# Patient Record
Sex: Female | Born: 1965 | Race: White | Hispanic: No | Smoking: Never smoker
Health system: Southern US, Community
[De-identification: ages and names within clinical notes are randomized; demographics above are authoritative.]

## PROBLEM LIST (undated history)

## (undated) ENCOUNTER — Emergency Department (HOSPITAL_BASED_OUTPATIENT_CLINIC_OR_DEPARTMENT_OTHER): Admission: EM | Source: Home / Self Care

## (undated) DIAGNOSIS — F419 Anxiety disorder, unspecified: Secondary | ICD-10-CM

## (undated) DIAGNOSIS — N200 Calculus of kidney: Secondary | ICD-10-CM

## (undated) DIAGNOSIS — F32A Depression, unspecified: Secondary | ICD-10-CM

## (undated) DIAGNOSIS — F329 Major depressive disorder, single episode, unspecified: Secondary | ICD-10-CM

## (undated) DIAGNOSIS — T7840XA Allergy, unspecified, initial encounter: Secondary | ICD-10-CM

## (undated) DIAGNOSIS — I059 Rheumatic mitral valve disease, unspecified: Secondary | ICD-10-CM

## (undated) HISTORY — DX: Rheumatic mitral valve disease, unspecified: I05.9

## (undated) HISTORY — DX: Depression, unspecified: F32.A

## (undated) HISTORY — DX: Anxiety disorder, unspecified: F41.9

## (undated) HISTORY — DX: Allergy, unspecified, initial encounter: T78.40XA

## (undated) HISTORY — PX: OTHER SURGICAL HISTORY: SHX169

## (undated) HISTORY — DX: Major depressive disorder, single episode, unspecified: F32.9

---

## 1988-08-27 HISTORY — PX: TONSILLECTOMY: SUR1361

## 2000-09-10 ENCOUNTER — Emergency Department (HOSPITAL_COMMUNITY): Admission: EM | Admit: 2000-09-10 | Discharge: 2000-09-11 | Payer: Self-pay | Admitting: Emergency Medicine

## 2000-09-11 ENCOUNTER — Encounter: Payer: Self-pay | Admitting: Emergency Medicine

## 2000-10-08 ENCOUNTER — Emergency Department (HOSPITAL_COMMUNITY): Admission: EM | Admit: 2000-10-08 | Discharge: 2000-10-08 | Payer: Self-pay | Admitting: Emergency Medicine

## 2002-08-27 HISTORY — PX: KIDNEY STONE SURGERY: SHX686

## 2003-12-18 ENCOUNTER — Emergency Department (HOSPITAL_COMMUNITY): Admission: EM | Admit: 2003-12-18 | Discharge: 2003-12-18 | Payer: Self-pay | Admitting: Emergency Medicine

## 2003-12-22 ENCOUNTER — Emergency Department (HOSPITAL_COMMUNITY): Admission: EM | Admit: 2003-12-22 | Discharge: 2003-12-22 | Payer: Self-pay | Admitting: Emergency Medicine

## 2004-03-08 ENCOUNTER — Emergency Department (HOSPITAL_COMMUNITY): Admission: EM | Admit: 2004-03-08 | Discharge: 2004-03-08 | Payer: Self-pay | Admitting: Emergency Medicine

## 2004-03-10 ENCOUNTER — Ambulatory Visit (HOSPITAL_BASED_OUTPATIENT_CLINIC_OR_DEPARTMENT_OTHER): Admission: RE | Admit: 2004-03-10 | Discharge: 2004-03-10 | Payer: Self-pay | Admitting: Urology

## 2004-03-10 ENCOUNTER — Ambulatory Visit (HOSPITAL_COMMUNITY): Admission: RE | Admit: 2004-03-10 | Discharge: 2004-03-10 | Payer: Self-pay | Admitting: Urology

## 2005-04-26 ENCOUNTER — Emergency Department (HOSPITAL_COMMUNITY): Admission: EM | Admit: 2005-04-26 | Discharge: 2005-04-26 | Payer: Self-pay | Admitting: Emergency Medicine

## 2006-04-07 ENCOUNTER — Emergency Department (HOSPITAL_COMMUNITY): Admission: EM | Admit: 2006-04-07 | Discharge: 2006-04-08 | Payer: Self-pay | Admitting: Emergency Medicine

## 2006-04-19 ENCOUNTER — Emergency Department (HOSPITAL_COMMUNITY): Admission: EM | Admit: 2006-04-19 | Discharge: 2006-04-19 | Payer: Self-pay | Admitting: Emergency Medicine

## 2006-05-15 ENCOUNTER — Inpatient Hospital Stay (HOSPITAL_COMMUNITY): Admission: RE | Admit: 2006-05-15 | Discharge: 2006-05-15 | Payer: Self-pay | Admitting: Family Medicine

## 2006-05-17 ENCOUNTER — Ambulatory Visit (HOSPITAL_COMMUNITY): Admission: RE | Admit: 2006-05-17 | Discharge: 2006-05-17 | Payer: Self-pay | Admitting: Gynecology

## 2006-05-17 ENCOUNTER — Ambulatory Visit: Payer: Self-pay | Admitting: Gynecology

## 2006-05-17 ENCOUNTER — Encounter (INDEPENDENT_AMBULATORY_CARE_PROVIDER_SITE_OTHER): Payer: Self-pay | Admitting: Specialist

## 2006-06-05 ENCOUNTER — Ambulatory Visit: Payer: Self-pay | Admitting: Obstetrics & Gynecology

## 2006-07-02 ENCOUNTER — Emergency Department (HOSPITAL_COMMUNITY): Admission: EM | Admit: 2006-07-02 | Discharge: 2006-07-02 | Payer: Self-pay | Admitting: Family Medicine

## 2006-08-01 ENCOUNTER — Emergency Department (HOSPITAL_COMMUNITY): Admission: EM | Admit: 2006-08-01 | Discharge: 2006-08-01 | Payer: Self-pay | Admitting: Family Medicine

## 2006-09-14 ENCOUNTER — Emergency Department (HOSPITAL_COMMUNITY): Admission: EM | Admit: 2006-09-14 | Discharge: 2006-09-14 | Payer: Self-pay | Admitting: Family Medicine

## 2007-01-22 ENCOUNTER — Emergency Department (HOSPITAL_COMMUNITY): Admission: EM | Admit: 2007-01-22 | Discharge: 2007-01-22 | Payer: Self-pay | Admitting: Emergency Medicine

## 2007-04-10 ENCOUNTER — Emergency Department (HOSPITAL_COMMUNITY): Admission: EM | Admit: 2007-04-10 | Discharge: 2007-04-10 | Payer: Self-pay | Admitting: Emergency Medicine

## 2007-05-07 ENCOUNTER — Inpatient Hospital Stay (HOSPITAL_COMMUNITY): Admission: AD | Admit: 2007-05-07 | Discharge: 2007-05-07 | Payer: Self-pay | Admitting: Obstetrics & Gynecology

## 2007-05-10 ENCOUNTER — Inpatient Hospital Stay (HOSPITAL_COMMUNITY): Admission: AD | Admit: 2007-05-10 | Discharge: 2007-05-10 | Payer: Self-pay | Admitting: Gynecology

## 2007-06-15 ENCOUNTER — Emergency Department (HOSPITAL_COMMUNITY): Admission: EM | Admit: 2007-06-15 | Discharge: 2007-06-15 | Payer: Self-pay | Admitting: Emergency Medicine

## 2007-06-26 ENCOUNTER — Inpatient Hospital Stay (HOSPITAL_COMMUNITY): Admission: AD | Admit: 2007-06-26 | Discharge: 2007-06-26 | Payer: Self-pay | Admitting: Obstetrics & Gynecology

## 2007-08-13 ENCOUNTER — Inpatient Hospital Stay (HOSPITAL_COMMUNITY): Admission: AD | Admit: 2007-08-13 | Discharge: 2007-08-13 | Payer: Self-pay | Admitting: Obstetrics & Gynecology

## 2007-08-28 ENCOUNTER — Inpatient Hospital Stay (HOSPITAL_COMMUNITY): Admission: AD | Admit: 2007-08-28 | Discharge: 2007-08-28 | Payer: Self-pay | Admitting: Obstetrics & Gynecology

## 2007-08-31 ENCOUNTER — Inpatient Hospital Stay (HOSPITAL_COMMUNITY): Admission: AD | Admit: 2007-08-31 | Discharge: 2007-08-31 | Payer: Self-pay | Admitting: Obstetrics and Gynecology

## 2008-01-09 ENCOUNTER — Emergency Department (HOSPITAL_COMMUNITY): Admission: EM | Admit: 2008-01-09 | Discharge: 2008-01-09 | Payer: Self-pay | Admitting: Family Medicine

## 2008-01-13 ENCOUNTER — Emergency Department (HOSPITAL_COMMUNITY): Admission: EM | Admit: 2008-01-13 | Discharge: 2008-01-13 | Payer: Self-pay | Admitting: Emergency Medicine

## 2008-04-01 ENCOUNTER — Emergency Department (HOSPITAL_COMMUNITY): Admission: EM | Admit: 2008-04-01 | Discharge: 2008-04-01 | Payer: Self-pay | Admitting: Emergency Medicine

## 2008-05-24 ENCOUNTER — Emergency Department (HOSPITAL_COMMUNITY): Admission: EM | Admit: 2008-05-24 | Discharge: 2008-05-24 | Payer: Self-pay | Admitting: Family Medicine

## 2008-08-28 ENCOUNTER — Inpatient Hospital Stay (HOSPITAL_COMMUNITY): Admission: AD | Admit: 2008-08-28 | Discharge: 2008-08-28 | Payer: Self-pay | Admitting: Obstetrics & Gynecology

## 2009-03-12 ENCOUNTER — Emergency Department (HOSPITAL_COMMUNITY): Admission: EM | Admit: 2009-03-12 | Discharge: 2009-03-12 | Payer: Self-pay | Admitting: Emergency Medicine

## 2009-03-22 ENCOUNTER — Emergency Department (HOSPITAL_COMMUNITY): Admission: EM | Admit: 2009-03-22 | Discharge: 2009-03-22 | Payer: Self-pay | Admitting: Family Medicine

## 2009-05-18 ENCOUNTER — Emergency Department (HOSPITAL_COMMUNITY): Admission: EM | Admit: 2009-05-18 | Discharge: 2009-05-18 | Payer: Self-pay | Admitting: Family Medicine

## 2010-09-16 ENCOUNTER — Encounter: Payer: Self-pay | Admitting: Obstetrics & Gynecology

## 2010-09-18 ENCOUNTER — Encounter: Payer: Self-pay | Admitting: Obstetrics & Gynecology

## 2010-11-21 ENCOUNTER — Other Ambulatory Visit (HOSPITAL_COMMUNITY): Payer: Self-pay | Admitting: Sports Medicine

## 2010-11-21 DIAGNOSIS — M669 Spontaneous rupture of unspecified tendon: Secondary | ICD-10-CM

## 2010-11-25 ENCOUNTER — Ambulatory Visit (HOSPITAL_COMMUNITY)
Admission: RE | Admit: 2010-11-25 | Discharge: 2010-11-25 | Disposition: A | Discharge status: Home / Self Care | Payer: Self-pay | Source: Ambulatory Visit | Attending: Sports Medicine | Admitting: Sports Medicine

## 2010-11-25 DIAGNOSIS — M669 Spontaneous rupture of unspecified tendon: Secondary | ICD-10-CM

## 2010-11-25 DIAGNOSIS — R29898 Other symptoms and signs involving the musculoskeletal system: Secondary | ICD-10-CM | POA: Insufficient documentation

## 2010-12-03 LAB — POCT URINALYSIS DIP (DEVICE)
Glucose, UA: NEGATIVE mg/dL
Ketones, ur: NEGATIVE mg/dL
Ketones, ur: NEGATIVE mg/dL
Nitrite: NEGATIVE
Protein, ur: NEGATIVE mg/dL
Specific Gravity, Urine: 1.02 (ref 1.005–1.030)
pH: 6.5 (ref 5.0–8.0)
pH: 7 (ref 5.0–8.0)

## 2010-12-03 LAB — URINE CULTURE
Colony Count: 70000
Colony Count: NO GROWTH
Culture: NO GROWTH

## 2010-12-03 LAB — WET PREP, GENITAL
Clue Cells Wet Prep HPF POC: NONE SEEN
Trich, Wet Prep: NONE SEEN
Trich, Wet Prep: NONE SEEN
WBC, Wet Prep HPF POC: NONE SEEN
Yeast Wet Prep HPF POC: NONE SEEN
Yeast Wet Prep HPF POC: NONE SEEN

## 2010-12-03 LAB — GC/CHLAMYDIA PROBE AMP, GENITAL
Chlamydia, DNA Probe: NEGATIVE
GC Probe Amp, Genital: NEGATIVE
GC Probe Amp, Genital: NEGATIVE

## 2010-12-11 LAB — WET PREP, GENITAL
Clue Cells Wet Prep HPF POC: NONE SEEN
Trich, Wet Prep: NONE SEEN
Yeast Wet Prep HPF POC: NONE SEEN

## 2010-12-11 LAB — URINALYSIS, ROUTINE W REFLEX MICROSCOPIC
Bilirubin Urine: NEGATIVE
Glucose, UA: NEGATIVE mg/dL
Leukocytes, UA: NEGATIVE
Nitrite: NEGATIVE
Urobilinogen, UA: 0.2 mg/dL (ref 0.0–1.0)

## 2010-12-11 LAB — POCT PREGNANCY, URINE: Preg Test, Ur: NEGATIVE

## 2010-12-11 LAB — URINE MICROSCOPIC-ADD ON

## 2011-01-12 NOTE — Op Note (Signed)
NAME:  Erica Zhang, Erica Zhang                           ACCOUNT NO.:  [id number]   MEDICAL RECORD NO.:  [id number]                   PATIENT TYPE:  AMB   LOCATION:  [facility]                                 FACILITY:  [facility]   PHYSICIAN:  Boston Service, M.D.             DATE OF BIRTH:  05-10-1966   DATE OF PROCEDURE:  03/10/2004  DATE OF DISCHARGE:                                 OPERATIVE REPORT   REFERRING PHYSICIAN:  [facility] [address].   PREOPERATIVE DIAGNOSIS:  A 6 mm ureteral calculus, right distal ureter.  Reference made to x-ray report, March 08, 2004, Yolande Jolly, MD.   POSTOPERATIVE DIAGNOSIS:  A 6 mm ureteral calculus, right distal ureter.  Reference made to x-ray report, March 08, 2004, Yolande Jolly, MD.   ANESTHESIA:  General.   DRAINS:  None.   COMPLICATIONS:  None.   DESCRIPTION OF PROCEDURE:  The patient was prepped and draped in the dorsal  lithotomy position after institution of an adequate level of general  anesthesia.  Well-lubricated 21 French panendoscope was gently inserted at  the urethral meatus.  Normal urethra and sphincter.  Normal trigone and  orifices.  Left retrograde shows normal course and caliber with prompt  drainage at 3-5 minutes.  Right retrograde showed densely calcified 6 mm  stone, right distal ureter with proximal hydronephrosis.  Guidewire was then  gently negotiated beyond the stone.  A 6 French end-hole catheter was passed  over the guidewire and then gently withdrawn.  Short 6 French ureteroscope  was then inserted.  Stone was easily visualized, was negotiated into the  Bard basket and then gently withdrawn.  It was recovered intact.  Ureteroscope was reinserted.  There was bullous edema of the distal ureter  but no evidence of perforation of other anatomic deformity.  No stent was  left indwelling.  The bladder was drained, and patient was returned to  recovery in satisfactory condition.                      Boston Service, M.D.    RH/MEDQ  D:  03/10/2004  T:  03/10/2004  Job:  [id number]   cc:   [facility]

## 2011-01-12 NOTE — Group Therapy Note (Signed)
NAME:  Erica Zhang, BLASKO NO.:  [id number]   MEDICAL RECORD NO.:  [id number]          PATIENT TYPE:  WOC   LOCATION:  [facility]                   FACILITY:  [facility]   PHYSICIAN:  Wilburt Finlay, M.D.     DATE OF BIRTH:  1965-10-02   DATE OF SERVICE:  06/05/2006                                    CLINIC NOTE   HISTORY:  Patient presents for two week followup on D&C.  Patient also  complains of left nipple discharge, mostly milky, but has had some bloody  discharge x2 days, which seems to have resolved.  Has some tenderness in the  left breast that also resolved.  Has never had a mammogram in the past.  Patient also complains of yellow vaginal discharge, vaginal dryness, and  itching.  Has not tried anything for the itching or the discharge.  Denies  any fevers, chills.  Denies vaginal bleeding.   PHYSICAL EXAMINATION:  VITAL SIGNS:  On exam, her vitals are afebrile.  Pulse 50, blood pressure 126/84.  Weight 216 pounds.  HEART:  Regular.  No murmurs.  LUNGS:  Clear to auscultation bilaterally.  ABDOMEN:  Soft, nontender, nondistended.  Appropriate bowel sounds.  BREASTS:  No masses or tenderness.  Positive milky discharge from bilateral  breasts with milking of breasts.  No bloody discharge from nipples.  PELVIC:  Patient with minimal vaginal discharge.  Cervix appears normal.  Uterus, small __________, inverted, mobile.  No adnexal masses or  tenderness.   ASSESSMENT/PLAN:  A 45 year old female two weeks status post dilatation and  curettage for a missed abortion.  Patient unsure of contraception.  Discussed bilateral tubal ligation versus  intrauterine device.  Patient unsure which she would like at this time.  Has  signed papers for bilateral tubal ligation.  The plan is for the patient to  call us back in two weeks with a decision regarding bilateral tubal ligation  versus intrauterine device.  Wet prep was done at this visit.  Will call  patient if results are  normal.  Patient recommended to get a mammogram.  The  schedule was made for her for June 11, 2006 at 9:00 a.m.           ______________________________  Wilburt Finlay, M.D.     LJ/MEDQ  D:  06/05/2006  T:  06/07/2006  Job:  [id number]

## 2011-01-12 NOTE — Op Note (Signed)
Erica Zhang, AULT            ACCOUNT NO.:  [id number]   MEDICAL RECORD NO.:  [id number]          PATIENT TYPE:  AMB   LOCATION:  [facility]                           [facility]   PHYSICIAN:  Ginger Carne, MD  DATE OF BIRTH:  08-01-1966   DATE OF PROCEDURE:  05/17/2006  DATE OF DISCHARGE:  05/17/2006                                 OPERATIVE REPORT   PREOPERATIVE DIAGNOSES:  Missed abortion at 8-weeks gestation.   POSTOPERATIVE DIAGNOSES:  Missed abortion at 8-weeks gestation.   PROCEDURE:  Suction, dilatation and evacuation.   SURGEON:  Ginger Carne, MD.   ASSISTANT:  Paticia Stack, MD.   ANESTHESIA:  Local.   SPECIMEN:  To pathology.   ESTIMATED BLOOD LOSS:  100 mL.   COMPLICATIONS:  None.   FINDINGS:  Eight-week size anteverted uterus, moderate amounts of products  of conception.   The patient was taken to the operating room and put under MAC anesthesia. A  sterile weight speculum was placed in the patient's vagina, the cervix  visualized. A sterile procedure was used. The patient cleaned with Betadine  and draped in a sterile fashion. 2 mL of local anesthetic was injected at  the 3 and 9 o'clock position on the cervix to produce a paracervical block  with a single tooth tenaculum prior to the anterior portion of the cervix.  The uterus was sounded to about 9 cm and the os dilated with serial  dilators. A #7 suction curette was used and advanced gently to the uterine  fundus. The suction device was then activated and the curette rotated to  clear the uterus of the products of conception. There was minimal bleeding  noted and the tenaculum removed with good hemostasis noted. The patient  tolerated the procedure well and was taken to the recovery room in stable  condition.    ______________________________  Paticia Stack, MD      Ginger Carne, MD  Electronically Signed   LNJ/MEDQ  D:  05/17/2006  T:  05/20/2006  Job:  [id number]

## 2011-05-17 LAB — URINALYSIS, ROUTINE W REFLEX MICROSCOPIC
Bilirubin Urine: NEGATIVE
Bilirubin Urine: NEGATIVE
Glucose, UA: NEGATIVE
Glucose, UA: NEGATIVE
Ketones, ur: NEGATIVE
Leukocytes, UA: NEGATIVE
Protein, ur: NEGATIVE
Specific Gravity, Urine: >1.03 — ABNORMAL HIGH
Urobilinogen, UA: 0.2
pH: 6
pH: 6

## 2011-05-17 LAB — URINE CULTURE

## 2011-05-17 LAB — URINE MICROSCOPIC-ADD ON

## 2011-05-17 LAB — POCT PREGNANCY, URINE
Operator id: 114931
Preg Test, Ur: NEGATIVE

## 2011-05-23 LAB — POCT URINALYSIS DIP (DEVICE)
Bilirubin Urine: NEGATIVE
Glucose, UA: NEGATIVE
Nitrite: NEGATIVE
Urobilinogen, UA: 0.2
pH: 5.5

## 2011-05-23 LAB — HERPES SIMPLEX VIRUS CULTURE: Culture: NOT DETECTED

## 2011-05-23 LAB — GC/CHLAMYDIA PROBE AMP, GENITAL
Chlamydia, DNA Probe: NEGATIVE
GC Probe Amp, Genital: NEGATIVE

## 2011-05-23 LAB — WET PREP, GENITAL: Clue Cells Wet Prep HPF POC: NONE SEEN

## 2011-05-23 LAB — URINE CULTURE

## 2011-06-01 LAB — GC/CHLAMYDIA PROBE AMP, GENITAL
Chlamydia, DNA Probe: NEGATIVE
GC Probe Amp, Genital: NEGATIVE

## 2011-06-01 LAB — URINE MICROSCOPIC-ADD ON

## 2011-06-01 LAB — CBC
HCT: 40.5
Hemoglobin: 14
MCHC: 34.6
MCV: 87.6
Platelets: 326
RBC: 4.62
RDW: 13.5
WBC: 10.7 — ABNORMAL HIGH

## 2011-06-01 LAB — URINALYSIS, ROUTINE W REFLEX MICROSCOPIC
Bilirubin Urine: NEGATIVE
Glucose, UA: NEGATIVE
Ketones, ur: NEGATIVE
Leukocytes, UA: NEGATIVE
Nitrite: NEGATIVE
Protein, ur: NEGATIVE
Specific Gravity, Urine: >1.03 — ABNORMAL HIGH
Urobilinogen, UA: 0.2
pH: 5

## 2011-06-01 LAB — WET PREP, GENITAL: Clue Cells Wet Prep HPF POC: NONE SEEN

## 2011-06-06 LAB — POCT URINALYSIS DIP (DEVICE)
Glucose, UA: NEGATIVE
Ketones, ur: NEGATIVE
Operator id: 235561
Protein, ur: 30 — AB

## 2011-06-06 LAB — URINALYSIS, ROUTINE W REFLEX MICROSCOPIC
Ketones, ur: NEGATIVE
Leukocytes, UA: NEGATIVE
Nitrite: NEGATIVE
pH: 6

## 2011-06-06 LAB — URINE MICROSCOPIC-ADD ON

## 2011-06-06 LAB — CBC
HCT: 40.6
MCV: 87.4
Platelets: 322
RBC: 4.64
WBC: 10.4

## 2011-06-06 LAB — URINE CULTURE

## 2011-06-06 LAB — WET PREP, GENITAL
Clue Cells Wet Prep HPF POC: NONE SEEN
Trich, Wet Prep: NONE SEEN

## 2011-06-06 LAB — POCT PREGNANCY, URINE: Operator id: 235561

## 2011-06-08 LAB — CBC
MCHC: 34.3
Platelets: 274
RBC: 4.41

## 2011-06-08 LAB — URINALYSIS, ROUTINE W REFLEX MICROSCOPIC
Bilirubin Urine: NEGATIVE
Glucose, UA: NEGATIVE
Ketones, ur: NEGATIVE
Leukocytes, UA: NEGATIVE
pH: 5.5

## 2011-06-08 LAB — WET PREP, GENITAL: Yeast Wet Prep HPF POC: NONE SEEN

## 2011-06-08 LAB — HCG, QUANTITATIVE, PREGNANCY
hCG, Beta Chain, Quant, S: 2284 — ABNORMAL HIGH
hCG, Beta Chain, Quant, S: 2399 — ABNORMAL HIGH

## 2011-06-08 LAB — POCT PREGNANCY, URINE: Preg Test, Ur: POSITIVE

## 2011-06-08 LAB — ABO/RH: ABO/RH(D): A POS

## 2011-06-08 LAB — URINE MICROSCOPIC-ADD ON

## 2011-08-26 ENCOUNTER — Emergency Department (HOSPITAL_COMMUNITY): Payer: Self-pay

## 2011-08-26 ENCOUNTER — Emergency Department (HOSPITAL_COMMUNITY)
Admission: EM | Admit: 2011-08-26 | Discharge: 2011-08-26 | Disposition: A | Discharge status: Home / Self Care | Payer: Self-pay | Attending: Emergency Medicine | Admitting: Emergency Medicine

## 2011-08-26 DIAGNOSIS — J189 Pneumonia, unspecified organism: Secondary | ICD-10-CM | POA: Insufficient documentation

## 2011-08-26 DIAGNOSIS — R6883 Chills (without fever): Secondary | ICD-10-CM | POA: Insufficient documentation

## 2011-08-26 DIAGNOSIS — R071 Chest pain on breathing: Secondary | ICD-10-CM | POA: Insufficient documentation

## 2011-08-26 DIAGNOSIS — R0602 Shortness of breath: Secondary | ICD-10-CM | POA: Insufficient documentation

## 2011-08-26 DIAGNOSIS — R093 Abnormal sputum: Secondary | ICD-10-CM | POA: Insufficient documentation

## 2011-08-26 DIAGNOSIS — R059 Cough, unspecified: Secondary | ICD-10-CM | POA: Insufficient documentation

## 2011-08-26 DIAGNOSIS — R05 Cough: Secondary | ICD-10-CM | POA: Insufficient documentation

## 2011-08-26 DIAGNOSIS — Z79899 Other long term (current) drug therapy: Secondary | ICD-10-CM | POA: Insufficient documentation

## 2011-08-26 MED ORDER — AZITHROMYCIN 250 MG PO TABS: ORAL_TABLET | ORAL | Status: AC

## 2011-08-26 MED ORDER — ALBUTEROL SULFATE HFA 108 (90 BASE) MCG/ACT IN AERS
2.0000 | INHALATION_SPRAY | RESPIRATORY_TRACT | Status: DC | PRN
  Administered 2011-08-26: 2 via RESPIRATORY_TRACT
  Filled 2011-08-26: qty 6.7

## 2011-08-26 NOTE — ED Provider Notes (Signed)
History     CSN: [id number]  Arrival date & time 08/26/11  1402   First MD Initiated Contact with Patient 08/26/11 1614      Chief Complaint  Patient presents with  . Cough    (Consider location/radiation/quality/duration/timing/severity/associated sxs/prior treatment) Patient is a 45 y.o. female presenting with cough. The history is provided by the patient.  Cough   patient with a remote history of asthma approximately 20 years ago with no symptoms since that time. Complains of a cough for the last 3-1/2 is that is productive of purulent sputum. Unchanged. Associated with chills but no measured fever. It is also associated with wheezing, shortness of breath,  and right lower rib pain described as aching worse with palpation or specific movements. The patient denies any sore thrthroat, chest pain, pleuritic pain, hemoptysis. She has tried Mucinex for the symptoms and the initial 2 weeks and did have some mild improvement. She is not a smoker. She has had no recent prolonged trips. She denies any lower extremity pain orswelling.   Past Medical History  Diagnosis Date  . Asthma     Past Surgical History  Procedure Date  . Tonsillectomy     No family history on file.  History  Substance Use Topics  . Smoking status: Never Smoker   . Smokeless tobacco: Not on file  . Alcohol Use: Yes    OB History    Grav Para Term Preterm Abortions TAB SAB Ect Mult Living                  Review of Systems  Respiratory: Positive for cough.   10 systems reviewed and are negative for acute change except as noted in the HPI.   Allergies  Aleve; Aspirin; Ibuprofen; and Wine  Home Medications   Current Outpatient Rx  Name Route Sig Dispense Refill  . GUAIFENESIN 100 MG/5ML PO LIQD Oral Take 400 mg by mouth 3 (three) times daily as needed. cough     . SERTRALINE HCL 50 MG PO TABS Oral Take 50 mg by mouth daily.        BP 130/65  Pulse 70  Temp(Src) 98.6 F (37 C) (Oral)  Resp  20  SpO2 100%  LMP 08/10/2011  Physical Exam  Nursing note and vitals reviewed. Constitutional: She is oriented to person, place, and time. She appears well-developed and well-nourished. No distress.  HENT:  Head: Normocephalic and atraumatic.  Right Ear: External ear normal.  Left Ear: External ear normal.  Nose: Nose normal.  Mouth/Throat: Oropharynx is clear and moist.  Eyes: EOM are normal. Pupils are equal, round, and reactive to light.  Neck: Normal range of motion. Neck supple.  Cardiovascular: Normal rate, regular rhythm and intact distal pulses.   Pulmonary/Chest: Effort normal and breath sounds normal. No respiratory distress. She has no wheezes.       Left lower chest wall tenderness to palpation  Abdominal: Soft. She exhibits no distension. There is no tenderness.  Musculoskeletal: Normal range of motion. She exhibits no edema and no tenderness.  Lymphadenopathy:    She has no cervical adenopathy.  Neurological: She is alert and oriented to person, place, and time. Coordination normal.  Skin: Skin is warm and dry. No rash noted.  Psychiatric: Her behavior is normal.    ED Course  Procedures (including critical care time)  Labs Reviewed - No data to display Dg Chest 2 View  08/26/2011  *RADIOLOGY REPORT*  Clinical Data: Cough and congestion.  CHEST - 2 VIEW  Comparison: None.  Findings: Focal opacity in the right infrahilar region most likely represents atelectasis.  Early infiltrate is not excluded.  There is no evidence of edema or pleural fluid.  Heart size is within normal limits.  Bony thorax is unremarkable.  IMPRESSION: Right infrahilar density likely represents atelectasis.  Early infiltrate is not excluded.  Original Report Authenticated By: Reola Calkins, M.D.     Dx 1: CAP   MDM  Three and a half half weeks of cough. As patient has no pleuritic pain, is not tachycardic, is not hypoxic, and does not have hemoptysis, and has no risk factors or recent  prolonged trips, do not suspect a pulmonary embolus. Her chest x-rays and reviewed in done earlier for treatment be excluded. She will be treated for community acquired pneumonia. She has been advised to follow up with her regular doctor she has continued symptoms or concerns.        Elwyn Reach Gaffney, [place] 08/26/11 [phone]

## 2011-08-26 NOTE — ED Notes (Signed)
Pt in from home with c/o chronic cough and wheezing for several days no relief with medication

## 2011-08-27 NOTE — ED Provider Notes (Signed)
Medical screening examination/treatment/procedure(s) were performed by non-physician practitioner and as supervising physician I was immediately available for consultation/collaboration. Neida Ellegood Y.   Leatha Rohner Y. Marlow Hendrie, MD 08/27/11 1026 

## 2011-12-08 ENCOUNTER — Encounter (HOSPITAL_COMMUNITY): Payer: Self-pay

## 2011-12-08 ENCOUNTER — Emergency Department (HOSPITAL_COMMUNITY)
Admission: EM | Admit: 2011-12-08 | Discharge: 2011-12-08 | Disposition: A | Discharge status: Home / Self Care | Payer: Self-pay | Source: Home / Self Care

## 2011-12-08 DIAGNOSIS — F329 Major depressive disorder, single episode, unspecified: Secondary | ICD-10-CM

## 2011-12-08 MED ORDER — SERTRALINE HCL 100 MG PO TABS: 100.0000 mg | ORAL_TABLET | Freq: Every day | ORAL | Status: DC

## 2011-12-08 NOTE — ED Notes (Signed)
Pt needs refill on zoloft,  She sees Dr. Elmer Picker for refills and he has closed his office.  Ran out of medication two days ago.

## 2011-12-08 NOTE — Discharge Instructions (Signed)
Schedule an appt with a new primary care dr. I have provided you with the [facility] information. They see uninsured patients. Return as needed.  Depression You have signs of depression. This is a common problem. It can occur at any age. It is often hard to recognize. People can suffer from depression and still have moments of enjoyment. Depression interferes with your basic ability to function in life. It upsets your relationships, sleep, eating, and work habits. CAUSES  Depression is believed to be caused by an imbalance in brain chemicals. It may be triggered by an unpleasant event. Relationship crises, a death in the family, financial worries, retirement, or other stressors are normal causes of depression. Depression may also start for no known reason. Other factors that may play a part include medical illnesses, some medicines, genetics, and alcohol or drug abuse. SYMPTOMS   Feeling unhappy or worthless.   Long-lasting (chronic) tiredness or worn-out feeling.   Self-destructive thoughts and actions.   Not being able to sleep or sleeping too much.   Eating more than usual or not eating at all.   Headaches or feeling anxious.   Trouble concentrating or making decisions.   Unexplained physical problems and substance abuse.  TREATMENT  Depression usually gets better with treatment. This can include:  Antidepressant medicines. It can take weeks before the proper dose is achieved and benefits are reached.   Talking with a therapist, clergyperson, counselor, or friend. These people can help you gain insight into your problem and regain control of your life.   Eating a good diet.   Getting regular physical exercise, such as walking for 30 minutes every day.   Not abusing alcohol or drugs.  Treating depression often takes 6 months or longer. This length of treatment is needed to keep symptoms from returning. Call your caregiver and arrange for follow-up care as  suggested. SEEK IMMEDIATE MEDICAL CARE IF:   You start to have thoughts of hurting yourself or others.   Call your local emergency services (911 in [place]   Go to your local medical emergency department.   Call the National Suicide Prevention Lifeline: 1-800-273-TALK 3216661003).  Document Released: 08/13/2005 Document Revised: 08/02/2011 Document Reviewed: 01/13/2010 [facility] Patient Information 2012 [place], [place].

## 2011-12-08 NOTE — ED Provider Notes (Signed)
Medical screening examination/treatment/procedure(s) were performed by non-physician practitioner and as supervising physician I was immediately available for consultation/collaboration.  Brittnie Lewey, M.D.   Lisvet Rasheed C Jadine Brumley, MD 12/08/11 2040 

## 2011-12-08 NOTE — ED Provider Notes (Signed)
History     CSN: [id number]  Arrival date & time 12/08/11  1311   None     Chief Complaint  Patient presents with  . Medication Refill    (Consider location/radiation/quality/duration/timing/severity/associated sxs/prior treatment) HPI Comments: Patient presents today requesting a refill of her Zoloft 100 mg. She reports that she has a history of depression for many years, unchanged. She states that the primary care provider whom she was getting refills from closed his practice. She recently discovered this and is out of refills. She ran out 2 days ago and has noticed that she is more emotional. She denies suicidal or homicidal ideation, headache, dizziness, N/V/D or abdominal pain.    Past Medical History  Diagnosis Date  . Asthma     Past Surgical History  Procedure Date  . Tonsillectomy     History reviewed. No pertinent family history.  History  Substance Use Topics  . Smoking status: Never Smoker   . Smokeless tobacco: Not on file  . Alcohol Use: Yes    OB History    Grav Para Term Preterm Abortions TAB SAB Ect Mult Living                  Review of Systems  Constitutional: Negative for fever and chills.  Respiratory: Negative for cough and shortness of breath.   Cardiovascular: Negative for chest pain.  Psychiatric/Behavioral: Positive for sleep disturbance. Negative for suicidal ideas, confusion and agitation.    Allergies  Aleve; Aspirin; Ibuprofen; and Wine  Home Medications   Current Outpatient Rx  Name Route Sig Dispense Refill  . SERTRALINE HCL 100 MG PO TABS Oral Take 1 tablet (100 mg total) by mouth daily. 30 tablet 0    BP 151/79  Pulse 66  Temp(Src) 98.3 F (36.8 C) (Oral)  Resp 20  SpO2 100%  LMP 12/07/2011  Physical Exam  Nursing note and vitals reviewed. Constitutional: She appears well-developed and well-nourished. No distress.  HENT:  Head: Normocephalic and atraumatic.  Right Ear: Tympanic membrane, external ear and ear  canal normal.  Left Ear: Tympanic membrane, external ear and ear canal normal.  Nose: Nose normal.  Mouth/Throat: Uvula is midline, oropharynx is clear and moist and mucous membranes are normal. No oropharyngeal exudate, posterior oropharyngeal edema or posterior oropharyngeal erythema.  Neck: Neck supple.  Cardiovascular: Normal rate, regular rhythm and normal heart sounds.   Pulmonary/Chest: Effort normal and breath sounds normal. No respiratory distress.  Lymphadenopathy:    She has no cervical adenopathy.  Neurological: She is alert.  Skin: Skin is warm and dry.  Psychiatric: She has a normal mood and affect.    ED Course  Procedures (including critical care time)  Labs Reviewed - No data to display No results found.   1. Depression       MDM  1 mos refill of Zoloft 100 mg. Pt is uninsured, so provided her with [facility] information to establish care with a new PCP.         Melody Comas, [place] 12/08/11 1541

## 2012-02-06 ENCOUNTER — Telehealth (HOSPITAL_COMMUNITY): Payer: Self-pay | Admitting: *Deleted

## 2012-02-06 NOTE — ED Notes (Signed)
Pt. called on VM and said she was seen for depression on 4/13 and wants a refill of Zoloft until she can find a doctor. Pt. Was referred to the [facility] clinic on 4/13. Discussed with Dr. Lorenz Coaster and he said she will have to come back.  She needs to get a PCP.  I called pt. and told her this.  She said she did not want to go to a clinic.  She has been saving her money so she can get a psychiatrist.  I explained that if she got the PCP, they could do the refills of her medications and she could still go to a psychiatrist for counseling. Pt. voiced understanding. Vassie Moselle 02/06/2012

## 2012-02-08 ENCOUNTER — Ambulatory Visit: Payer: Self-pay | Admitting: Emergency Medicine

## 2012-02-08 VITALS — BP 130/80 | HR 70 | Temp 97.9°F | Resp 16

## 2012-02-08 DIAGNOSIS — F410 Panic disorder [episodic paroxysmal anxiety] without agoraphobia: Secondary | ICD-10-CM

## 2012-02-08 MED ORDER — SERTRALINE HCL 100 MG PO TABS: 100.0000 mg | ORAL_TABLET | Freq: Every day | ORAL | Status: DC

## 2012-02-08 NOTE — Progress Notes (Signed)
   Patient Name: Erica Zhang Date of Birth: 1966/04/23 Medical Record Number: [id number] Gender: female Date of Encounter: 02/08/2012  History of Present Illness:  Erica Zhang is a 46 y.o. very pleasant female patient who presents with the following:  Patient is here for a refill on her zoloft.  She has been on a stable dose for years with no adverse effects  There is no problem list on file for this patient.  Past Medical History  Diagnosis Date  . Asthma    Past Surgical History  Procedure Date  . Tonsillectomy    History  Substance Use Topics  . Smoking status: Never Smoker   . Smokeless tobacco: Not on file  . Alcohol Use: Yes   No family history on file. Allergies  Allergen Reactions  . Aspirin Anaphylaxis  . Ibuprofen Anaphylaxis  . Naproxen Sodium Anaphylaxis  . Wine (Alcohol) Anaphylaxis    Medication list has been reviewed and updated.  Prior to Admission medications   Medication Sig Start Date End Date Taking? Authorizing Provider  sertraline (ZOLOFT) 100 MG tablet Take 1 tablet (100 mg total) by mouth daily. 12/08/11  Yes Dawn Vidal Schwalbe, PA    Review of Systems:  As per HPI, otherwise negative.    Physical Examination: Filed Vitals:   02/08/12 1334  BP: 130/80  Pulse: 70  Temp: 97.9 F (36.6 C)  Resp: 16   There were no vitals filed for this visit. There is no height or weight on file to calculate BMI. Ideal Body Weight:     GEN: WDWN, NAD, Non-toxic, Alert & Oriented x 3 HEENT: Atraumatic, Normocephalic.  Ears and Nose: No external deformity. EXTR: No clubbing/cyanosis/edema NEURO: Normal gait.  PSYCH: Normally interactive. Conversant. Not depressed or anxious appearing.  Calm demeanor.   Assessment and Plan:  Panic disorder  Plan to continue her zoloft  Carmelina Dane, MD

## 2012-06-12 ENCOUNTER — Emergency Department (HOSPITAL_COMMUNITY)
Admission: EM | Admit: 2012-06-12 | Discharge: 2012-06-12 | Disposition: A | Discharge status: Home / Self Care | Payer: Self-pay | Attending: Emergency Medicine | Admitting: Emergency Medicine

## 2012-06-12 ENCOUNTER — Encounter (HOSPITAL_COMMUNITY): Payer: Self-pay | Admitting: *Deleted

## 2012-06-12 DIAGNOSIS — T148XXA Other injury of unspecified body region, initial encounter: Secondary | ICD-10-CM

## 2012-06-12 DIAGNOSIS — M79609 Pain in unspecified limb: Secondary | ICD-10-CM | POA: Insufficient documentation

## 2012-06-12 DIAGNOSIS — J45909 Unspecified asthma, uncomplicated: Secondary | ICD-10-CM | POA: Insufficient documentation

## 2012-06-12 DIAGNOSIS — IMO0002 Reserved for concepts with insufficient information to code with codable children: Secondary | ICD-10-CM | POA: Insufficient documentation

## 2012-06-12 DIAGNOSIS — X500XXA Overexertion from strenuous movement or load, initial encounter: Secondary | ICD-10-CM | POA: Insufficient documentation

## 2012-06-12 DIAGNOSIS — Y92009 Unspecified place in unspecified non-institutional (private) residence as the place of occurrence of the external cause: Secondary | ICD-10-CM | POA: Insufficient documentation

## 2012-06-12 NOTE — ED Provider Notes (Signed)
History     CSN: [id number]  Arrival date & time 06/12/12  1428   First MD Initiated Contact with Patient 06/12/12 1539      Chief Complaint  Patient presents with  . Muscle Pain    (Consider location/radiation/quality/duration/timing/severity/associated sxs/prior treatment) HPI Comments: Patient slipped in her kitchen today and did a "split" as she fell to the floor approximately 1330. Patient complains of left posterior thigh pain. Patient states that she can walk but with a limp. No treatments prior to arrival. She denies knee pain or ankle pain. She denies back pain or neck pain. Onset acute. Course is constant. Walking makes pain worse. Nothing makes it better.  Patient is a 46 y.o. female presenting with musculoskeletal pain. The history is provided by the patient.  Muscle Pain Associated symptoms include myalgias. Pertinent negatives include no arthralgias, joint swelling, neck pain, numbness or weakness.    Past Medical History  Diagnosis Date  . Asthma     Past Surgical History  Procedure Date  . Tonsillectomy   . Dnc     No family history on file.  History  Substance Use Topics  . Smoking status: Never Smoker   . Smokeless tobacco: Not on file  . Alcohol Use: Yes    OB History    Grav Para Term Preterm Abortions TAB SAB Ect Mult Living                  Review of Systems  Constitutional: Negative for activity change.  HENT: Negative for neck pain.   Musculoskeletal: Positive for myalgias and gait problem. Negative for back pain, joint swelling and arthralgias.  Skin: Negative for wound.  Neurological: Negative for weakness and numbness.    Allergies  Aspirin; Ibuprofen; Naproxen sodium; and Wine  Home Medications   Current Outpatient Rx  Name Route Sig Dispense Refill  . SERTRALINE HCL 100 MG PO TABS Oral Take 100 mg by mouth at bedtime.      BP 139/87  Pulse 74  Temp 98.6 F (37 C) (Oral)  Resp 16  Ht 5\' 7"  (1.702 m)  Wt 220 lb  (99.791 kg)  BMI 34.46 kg/m2  SpO2 99%  LMP 05/19/2012  Physical Exam  Nursing note and vitals reviewed. Constitutional: She appears well-developed and well-nourished.  HENT:  Head: Normocephalic and atraumatic.  Eyes: Pupils are equal, round, and reactive to light.  Neck: Normal range of motion. Neck supple.  Cardiovascular: Exam reveals no decreased pulses.   Pulses:      Dorsalis pedis pulses are 2+ on the right side, and 2+ on the left side.       Posterior tibial pulses are 2+ on the right side, and 2+ on the left side.  Musculoskeletal: She exhibits tenderness. She exhibits no edema.       Left hip: Normal. She exhibits normal range of motion, no tenderness and no bony tenderness.       Left knee: Normal. She exhibits normal range of motion. no tenderness found.       Left upper leg: She exhibits tenderness. She exhibits no bony tenderness and no swelling.       Legs:      Antalgic gait, but patient can ambulate and bear weight on L leg.   Neurological: She is alert. No sensory deficit.       Motor, sensation, and vascular distal to the injury is fully intact.   Skin: Skin is warm and dry.  Psychiatric: She has  a normal mood and affect.    ED Course  Procedures (including critical care time)  Labs Reviewed - No data to display No results found.   1. Muscle strain    4:42 PM Patient seen and examined. Counseled on RICE. Ortho referral given in no improvement in 1 week. Crutches given by orthopedic tech. Patient used Tylenol as needed for pain.  Vital signs reviewed and are as follows: Filed Vitals:   06/12/12 1526  BP: 139/87  Pulse: 74  Temp: 98.6 F (37 C)  Resp: 16       MDM  Patient with symptoms consistent with muscle strain. Patient has full range of motion in left hip and left knee. Conservative management with crutches and rice protocol indicated with orthopedic followup if not improved in a week. No concern for neurovascular injury.         Renne Crigler, [place] 06/12/12 [phone]

## 2012-06-12 NOTE — ED Notes (Signed)
Pt verbalizes understanding 

## 2012-06-12 NOTE — ED Notes (Signed)
Pt from home with c/o leg muscle pain. Pt sts she was in her kitchen this morning when she lost footing and did a "split". Pt sts left leg hurting the most on the back side of her thigh. Pt rates pain 5/10 and describes pain as aching/throbbing pain. sts she has not taken anything for pain. Pt sts she is concerned she may have torn or hurt something and she wanted to get it checked out.

## 2012-06-14 NOTE — ED Provider Notes (Signed)
Medical screening examination/treatment/procedure(s) were performed by non-physician practitioner and as supervising physician I was immediately available for consultation/collaboration.   Flordia Kassem M Kenderick Kobler, MD 06/14/12 1535 

## 2012-12-03 ENCOUNTER — Ambulatory Visit: Payer: Self-pay | Admitting: Emergency Medicine

## 2012-12-03 VITALS — BP 138/80 | HR 102 | Temp 99.2°F | Resp 16 | Ht 67.0 in

## 2012-12-03 DIAGNOSIS — J209 Acute bronchitis, unspecified: Secondary | ICD-10-CM

## 2012-12-03 DIAGNOSIS — F329 Major depressive disorder, single episode, unspecified: Secondary | ICD-10-CM

## 2012-12-03 MED ORDER — SERTRALINE HCL 100 MG PO TABS: 100.0000 mg | ORAL_TABLET | Freq: Every day | ORAL | Status: DC

## 2012-12-03 MED ORDER — AZITHROMYCIN 250 MG PO TABS: ORAL_TABLET | ORAL | Status: DC

## 2012-12-03 MED ORDER — PROMETHAZINE-CODEINE 6.25-10 MG/5ML PO SYRP
5.0000 mL | ORAL_SOLUTION | ORAL | Status: DC | PRN
Start: 2012-12-03 — End: 2013-01-19

## 2012-12-03 MED ORDER — FLUCONAZOLE 150 MG PO TABS: 150.0000 mg | ORAL_TABLET | Freq: Once | ORAL | Status: DC

## 2012-12-03 NOTE — Patient Instructions (Addendum)

## 2012-12-03 NOTE — Progress Notes (Signed)
[facility] [address], [place] [address] [phone]  Date:  12/03/2012   Name:  Erica Zhang   DOB:  December 12, 1965   MRN:  [id number]  PCP:  No primary provider on file.    Chief Complaint: Laryngitis and Medication Refill   History of Present Illness:  Erica Zhang is a 47 y.o. very pleasant female patient who presents with the following:  Ill with a cough productive of purulent sputum occasionally.  No wheezing or shortness of breath.  Has hoarseness.  No nasal congestion or drainage no fever or chills.  No nausea or vomiting.  Ill contacts.  No improvement with over the counter medications or other home remedies. Denies other complaint or health concern today.   There is no problem list on file for this patient.   Past Medical History  Diagnosis Date  . Asthma     Past Surgical History  Procedure Laterality Date  . Tonsillectomy    . Dnc      History  Substance Use Topics  . Smoking status: Never Smoker   . Smokeless tobacco: Not on file  . Alcohol Use: Yes    History reviewed. No pertinent family history.  Allergies  Allergen Reactions  . Aspirin Anaphylaxis  . Ibuprofen Anaphylaxis  . Naproxen Sodium Anaphylaxis  . Wine (Alcohol) Anaphylaxis    Medication list has been reviewed and updated.  Current Outpatient Prescriptions on File Prior to Visit  Medication Sig Dispense Refill  . sertraline (ZOLOFT) 100 MG tablet Take 100 mg by mouth at bedtime.       No current facility-administered medications on file prior to visit.    Review of Systems:  As per HPI, otherwise negative.    Physical Examination: Filed Vitals:   12/03/12 1301  BP: 138/80  Pulse: 102  Temp: 99.2 F (37.3 C)  Resp: 16   Filed Vitals:   12/03/12 1301  Height: 5\' 7"  (1.702 m)   There is no weight on file to calculate BMI. Ideal Body Weight: Weight in (lb) to have BMI = 25: 159.3  GEN: WDWN, NAD, Non-toxic, A & O x 3 HEENT:  Atraumatic, Normocephalic. Neck supple. No masses, No LAD. Ears and Nose: No external deformity. CV: RRR, No M/G/R. No JVD. No thrill. No extra heart sounds. PULM: CTA B, no wheezes, crackles, rhonchi. No retractions. No resp. distress. No accessory muscle use. ABD: S, NT, ND, +BS. No rebound. No HSM. EXTR: No c/c/e NEURO Normal gait.  PSYCH: Normally interactive. Conversant. Not depressed or anxious appearing.  Calm demeanor.    Assessment and Plan: Bronchitis Depression zpak Promethazine with codeine Refill zoloft  Signed,  Phillips Odor, MD

## 2013-01-18 ENCOUNTER — Emergency Department (HOSPITAL_COMMUNITY)
Admission: EM | Admit: 2013-01-18 | Discharge: 2013-01-19 | Disposition: A | Discharge status: Home / Self Care | Payer: Self-pay | Attending: Emergency Medicine | Admitting: Emergency Medicine

## 2013-01-18 ENCOUNTER — Encounter (HOSPITAL_COMMUNITY): Payer: Self-pay | Admitting: *Deleted

## 2013-01-18 DIAGNOSIS — N760 Acute vaginitis: Secondary | ICD-10-CM | POA: Insufficient documentation

## 2013-01-18 DIAGNOSIS — Z87440 Personal history of urinary (tract) infections: Secondary | ICD-10-CM | POA: Insufficient documentation

## 2013-01-18 DIAGNOSIS — Z3202 Encounter for pregnancy test, result negative: Secondary | ICD-10-CM | POA: Insufficient documentation

## 2013-01-18 DIAGNOSIS — Z87442 Personal history of urinary calculi: Secondary | ICD-10-CM | POA: Insufficient documentation

## 2013-01-18 DIAGNOSIS — R319 Hematuria, unspecified: Secondary | ICD-10-CM | POA: Insufficient documentation

## 2013-01-18 DIAGNOSIS — J45909 Unspecified asthma, uncomplicated: Secondary | ICD-10-CM | POA: Insufficient documentation

## 2013-01-18 DIAGNOSIS — B9689 Other specified bacterial agents as the cause of diseases classified elsewhere: Secondary | ICD-10-CM

## 2013-01-18 HISTORY — DX: Calculus of kidney: N20.0

## 2013-01-18 NOTE — ED Notes (Signed)
Pt states she is having some spotting, seen at [facility] on Friday and treated for BV, pt states she is not sure what is going on.

## 2013-01-19 LAB — URINALYSIS, ROUTINE W REFLEX MICROSCOPIC
Bilirubin Urine: NEGATIVE
Nitrite: NEGATIVE
Specific Gravity, Urine: 1.025 (ref 1.005–1.030)
Urobilinogen, UA: 0.2 mg/dL (ref 0.0–1.0)
pH: 5 (ref 5.0–8.0)

## 2013-01-19 LAB — PREGNANCY, URINE: Preg Test, Ur: NEGATIVE

## 2013-01-19 LAB — WET PREP, GENITAL
Trich, Wet Prep: NONE SEEN
Yeast Wet Prep HPF POC: NONE SEEN

## 2013-01-19 NOTE — ED Provider Notes (Addendum)
History     CSN: [id number]  Arrival date & time 01/18/13  2339   First MD Initiated Contact with Patient 01/19/13 [phone]      Chief Complaint  Patient presents with  . Vaginal Bleeding    (Consider location/radiation/quality/duration/timing/severity/associated sxs/prior treatment) HPI Comments: Patient with vaginal spotting for the past 2 weeks [facility] Dx with UTI and BV stared on Cipro and Flagyl but is concerned that she still has spotting.  Reports that she has hematuria always since last kidney stone  Has seen urology for same   Patient is a 47 y.o. female presenting with vaginal bleeding. The history is provided by the patient.  Vaginal Bleeding Quality:  Spotting and lighter than menses Severity:  Mild Onset quality:  Unable to specify Duration:  2 weeks Timing:  Constant Progression:  Unchanged Chronicity:  New Menstrual history:  Regular Possible pregnancy: no   Context: spontaneously   Relieved by:  Nothing Worsened by:  Nothing tried Ineffective treatments:  Prescription medications Associated symptoms: no abdominal pain, no dizziness, no dysuria, no fever, no nausea and no vaginal discharge   Risk factors: no hx of ectopic pregnancy, no hx of endometriosis, no gynecological surgery, does not have multiple partners, no ovarian cysts, no PID, no STD exposure and does not have unprotected sex     Past Medical History  Diagnosis Date  . Asthma   . Kidney stone     Past Surgical History  Procedure Laterality Date  . Tonsillectomy    . Dnc      No family history on file.  History  Substance Use Topics  . Smoking status: Never Smoker   . Smokeless tobacco: Not on file  . Alcohol Use: Yes    OB History   Grav Para Term Preterm Abortions TAB SAB Ect Mult Living                  Review of Systems  Unable to perform ROS Constitutional: Negative for fever and chills.  Gastrointestinal: Negative for nausea, vomiting and  abdominal pain.  Genitourinary: Positive for hematuria and vaginal bleeding. Negative for dysuria, frequency and vaginal discharge.  Musculoskeletal: Negative for myalgias.  Skin: Negative for pallor.  Neurological: Negative for dizziness and weakness.  All other systems reviewed and are negative.    Allergies  Aspirin; Ibuprofen; and Naproxen sodium  Home Medications   Current Outpatient Rx  Name  Route  Sig  Dispense  Refill  . ciprofloxacin (CIPRO) 500 MG tablet   Oral   Take 500 mg by mouth 2 (two) times daily. UTI         . metroNIDAZOLE (FLAGYL) 500 MG tablet   Oral   Take 500 mg by mouth 2 (two) times daily. 7 day course         . sertraline (ZOLOFT) 100 MG tablet   Oral   Take 1 tablet (100 mg total) by mouth at bedtime.   30 tablet   2     BP 131/87  Pulse 73  Temp(Src) 99 F (37.2 C) (Oral)  Resp 18  Wt 230 lb (104.327 kg)  BMI 36.01 kg/m2  SpO2 100%  LMP 01/01/2013  Physical Exam  Constitutional: She is oriented to person, place, and time. She appears well-developed and well-nourished.  HENT:  Head: Normocephalic.  Eyes: Pupils are equal, round, and reactive to light.  Neck: Normal range of motion.  Cardiovascular: Normal rate.   Pulmonary/Chest: Effort normal.  Abdominal: Soft.  Genitourinary: Vaginal discharge found.  Musculoskeletal: Normal range of motion.  Neurological: She is alert and oriented to person, place, and time.  Skin: Skin is warm.    ED Course  Procedures (including critical care time)  Labs Reviewed  WET PREP, GENITAL - Abnormal; Notable for the following:    Clue Cells Wet Prep HPF POC FEW (*)    All other components within normal limits  URINALYSIS, ROUTINE W REFLEX MICROSCOPIC - Abnormal; Notable for the following:    Hgb urine dipstick TRACE (*)    Leukocytes, UA SMALL (*)    All other components within normal limits  URINE MICROSCOPIC-ADD ON - Abnormal; Notable for the following:    Squamous Epithelial / LPF  FEW (*)    All other components within normal limits  PREGNANCY, URINE   No results found.   1. Bacterial vaginal infection       MDM  Reassured the a small amount of vaginal bleeding is normal with BV infections         Arman Filter, NP 01/19/13 0253  Arman Filter, NP 02/10/13 2035

## 2013-01-19 NOTE — ED Provider Notes (Signed)
Medical screening examination/treatment/procedure(s) were performed by non-physician practitioner and as supervising physician I was immediately available for consultation/collaboration.  Aaryanna Hyden M Keimya Briddell, MD 01/19/13 0402 

## 2013-02-12 NOTE — ED Provider Notes (Signed)
Medical screening examination/treatment/procedure(s) were performed by non-physician practitioner and as supervising physician I was immediately available for consultation/collaboration.   Lyanne Co, MD 02/12/13 [phone]

## 2013-06-10 ENCOUNTER — Other Ambulatory Visit: Payer: Self-pay | Admitting: Emergency Medicine

## 2013-06-11 ENCOUNTER — Other Ambulatory Visit: Payer: Self-pay

## 2013-06-11 MED ORDER — SERTRALINE HCL 100 MG PO TABS: 100.0000 mg | ORAL_TABLET | Freq: Every day | ORAL | Status: AC

## 2014-11-18 ENCOUNTER — Ambulatory Visit (INDEPENDENT_AMBULATORY_CARE_PROVIDER_SITE_OTHER): Payer: Self-pay | Admitting: Family Medicine

## 2014-11-18 VITALS — BP 160/100 | HR 64 | Temp 98.1°F | Resp 16 | Ht 65.25 in | Wt 247.0 lb

## 2014-11-18 DIAGNOSIS — Z8659 Personal history of other mental and behavioral disorders: Secondary | ICD-10-CM

## 2014-11-18 DIAGNOSIS — R002 Palpitations: Secondary | ICD-10-CM

## 2014-11-18 DIAGNOSIS — Z136 Encounter for screening for cardiovascular disorders: Secondary | ICD-10-CM

## 2014-11-18 LAB — POCT CBC
Granulocyte percent: 71.1 %G (ref 37–80)
HCT, POC: 41.1 % (ref 37.7–47.9)
Hemoglobin: 12.6 g/dL (ref 12.2–16.2)
Lymph, poc: 2.7 (ref 0.6–3.4)
MCH, POC: 26.4 pg — AB (ref 27–31.2)
MCHC: 30.7 g/dL — AB (ref 31.8–35.4)
MCV: 85.7 fL (ref 80–97)
MID (CBC): 0.3 (ref 0–0.9)
MPV: 7.3 fL (ref 0–99.8)
PLATELET COUNT, POC: 298 10*3/uL (ref 142–424)
POC Granulocyte: 7.4 — AB (ref 2–6.9)
POC LYMPH PERCENT: 25.7 %L (ref 10–50)
POC MID %: 3.2 %M (ref 0–12)
RBC: 4.79 M/uL (ref 4.04–5.48)
RDW, POC: 14.7 %
WBC: 10.4 10*3/uL — AB (ref 4.6–10.2)

## 2014-11-18 LAB — POCT GLYCOSYLATED HEMOGLOBIN (HGB A1C): Hemoglobin A1C: 5.1

## 2014-11-18 MED ORDER — LORAZEPAM 1 MG PO TABS: 1.0000 mg | ORAL_TABLET | Freq: Two times a day (BID) | ORAL | Status: DC | PRN

## 2014-11-18 NOTE — Progress Notes (Signed)
11/19/2014 at 11:42 AM  Erica RampMaryellen H Zhang / DOB: 1965/11/09 / MRN: [id number]  The patient  does not have a problem list on file.  SUBJECTIVE  Chief complaint: Palpitations and Hypertension   History of present illness: Erica Zhang is 49 y.o. well appearing female presenting for palpitations that started last night and stopped after an hour.  She says was asleep when the episode started, and reports the episode woke her up. She could feel her heart beating quickly and forcefully, and she also felt mildly dizzy and mildly nauseas.  She denies chest pain, SOB, radiation of the pain with this episode.  She did call 911 and the EMS told her her EKG looked "okay" but told her that she should come in for a check.   She has had increased stress recently 2/2 to her family being in town.  She reports she has been anxious and has a history of panic attack, but feels this episode is different.       She is adopted.  She denies a history of DM, HTN and is a non smoker.  Her BMI is greater than 40.    She  has a past medical history of Asthma; Kidney stone; Depression; Allergy; Anxiety; and Mitral valve disorder.    She has a current medication list which includes the following prescription(s): Sertraline  Erica Zhang is allergic to aspirin; ibuprofen; and naproxen sodium. She  reports that she has never smoked. She has never used smokeless tobacco. She reports that she drinks alcohol. She reports that she does not use illicit drugs. She  has no sexual activity history on file. The patient  has past surgical history that includes Tonsillectomy and DNC.  Her family history is not on file. She was adopted.  Review of Systems  Constitutional: Negative for fever and chills.  HENT: Negative for congestion.   Respiratory: Negative for cough and shortness of breath.   Cardiovascular: Positive for palpitations. Negative for chest pain.  Gastrointestinal: Positive for nausea. Negative for vomiting and  abdominal pain.  Genitourinary: Negative.   Skin: Negative.   Neurological: Positive for dizziness. Negative for sensory change, speech change, focal weakness and headaches.  Psychiatric/Behavioral: Positive for depression. The patient is nervous/anxious.     OBJECTIVE  Her  height is 5' 5.25" (1.657 m) and weight is 247 lb (112.038 kg). Her oral temperature is 98.1 F (36.7 C). Her blood pressure is 160/100 and her pulse is 64. Her respiration is 16 and oxygen saturation is 100%.  The patient's body mass index is 40.81 kg/(m^2). BP rechecked by me at 130/90 on the left arm.   Physical Exam  Constitutional: She is oriented to person, place, and time. She appears well-developed and well-nourished. No distress.  HENT:  Right Ear: Hearing, tympanic membrane, external ear and ear canal normal.  Left Ear: Hearing, tympanic membrane, external ear and ear canal normal.  Nose: No mucosal edema. Right sinus exhibits no maxillary sinus tenderness and no frontal sinus tenderness. Left sinus exhibits no maxillary sinus tenderness and no frontal sinus tenderness.  Mouth/Throat: Uvula is midline, oropharynx is clear and moist and mucous membranes are normal.  Cardiovascular: Normal rate, regular rhythm and normal heart sounds.   Respiratory: Effort normal and breath sounds normal. She has no wheezes. She has no rales.  Neurological: She is alert and oriented to person, place, and time.  Skin: Skin is warm and dry. She is not diaphoretic.  Psychiatric: She has a normal mood  and affect.    Results for orders placed or performed in visit on 11/18/14 (from the past 24 hour(s))  POCT CBC     Status: Abnormal   Collection Time: 11/18/14  7:43 PM  Result Value Ref Range   WBC 10.4 (A) 4.6 - 10.2 K/uL   Lymph, poc 2.7 0.6 - 3.4   POC LYMPH PERCENT 25.7 10 - 50 %L   MID (cbc) 0.3 0 - 0.9   POC MID % 3.2 0 - 12 %M   POC Granulocyte 7.4 (A) 2 - 6.9   Granulocyte percent 71.1 37 - 80 %G   RBC 4.79 4.04 -  5.48 M/uL   Hemoglobin 12.6 12.2 - 16.2 g/dL   HCT, POC 16.1 09.6 - 47.9 %   MCV 85.7 80 - 97 fL   MCH, POC 26.4 (A) 27 - 31.2 pg   MCHC 30.7 (A) 31.8 - 35.4 g/dL   RDW, POC 04.5 %   Platelet Count, POC 298 142 - 424 K/uL   MPV 7.3 0 - 99.8 fL  POCT glycosylated hemoglobin (Hb A1C)     Status: None   Collection Time: 11/18/14  7:43 PM  Result Value Ref Range   Hemoglobin A1C 5.1     ASSESSMENT & PLAN  Erica Zhang was seen today for palpitations and hypertension.  Diagnoses and all orders for this visit:  Palpitations: Work up reassuring.  Will send to cards given patient self reported history of Mitral Valve disorder.  This problem certainly could be secondary to problem 3.   Orders: -     TSH -     POCT CBC -     Comprehensive metabolic panel -     EKG 12-Lead -     Ambulatory referral to Cardiology  Screening for heart disease Orders: -     POCT glycosylated hemoglobin (Hb A1C) -     Lipid panel  History of panic attacks Orders: -     LORazepam (ATIVAN) 1 MG tablet; Take 1 tablet (1 mg total) by mouth 2 (two) times daily as needed for anxiety.    The patient was advised to call or come back to clinic if she does not see an improvement in symptoms, or worsens with the above plan.   Deliah Boston, MHS, PA-C [facility] 11/19/2014 11:42 AM

## 2014-11-19 LAB — COMPREHENSIVE METABOLIC PANEL
ALBUMIN: 4 g/dL (ref 3.5–5.2)
ALT: 12 U/L (ref 0–35)
AST: 13 U/L (ref 0–37)
Alkaline Phosphatase: 64 U/L (ref 39–117)
BILIRUBIN TOTAL: 0.3 mg/dL (ref 0.2–1.2)
BUN: 8 mg/dL (ref 6–23)
CO2: 20 meq/L (ref 19–32)
Calcium: 8.7 mg/dL (ref 8.4–10.5)
Chloride: 108 mEq/L (ref 96–112)
Creat: 0.65 mg/dL (ref 0.50–1.10)
GLUCOSE: 78 mg/dL (ref 70–99)
Potassium: 4 mEq/L (ref 3.5–5.3)
SODIUM: 140 meq/L (ref 135–145)
TOTAL PROTEIN: 6.8 g/dL (ref 6.0–8.3)

## 2014-11-19 LAB — LIPID PANEL
Cholesterol: 172 mg/dL (ref 0–200)
HDL: 40 mg/dL — ABNORMAL LOW (ref >=46)
LDL CALC: 94 mg/dL (ref 0–99)
Total CHOL/HDL Ratio: 4.3 Ratio
Triglycerides: 191 mg/dL — ABNORMAL HIGH (ref <150)
VLDL: 38 mg/dL (ref 0–40)

## 2014-11-19 LAB — TSH: TSH: 1.479 u[IU]/mL (ref 0.350–4.500)

## 2014-11-20 DIAGNOSIS — Z8659 Personal history of other mental and behavioral disorders: Secondary | ICD-10-CM | POA: Insufficient documentation

## 2014-11-20 DIAGNOSIS — R002 Palpitations: Secondary | ICD-10-CM | POA: Insufficient documentation

## 2014-11-20 NOTE — Progress Notes (Signed)
History and physical examinations reviewed in detail with PA Clark.  EKG reviewed during visit and WNL.  Agree with assessment and plan. Kristi Paulita FujitaMartin Smith, M.D. [facility]  [place] [address] [place], [place]  [id number] [phone] phone [phone] fax

## 2014-12-12 ENCOUNTER — Emergency Department (HOSPITAL_COMMUNITY)
Admission: EM | Admit: 2014-12-12 | Discharge: 2014-12-13 | Disposition: A | Discharge status: Home / Self Care | Payer: Self-pay | Attending: Emergency Medicine | Admitting: Emergency Medicine

## 2014-12-12 ENCOUNTER — Encounter (HOSPITAL_COMMUNITY): Payer: Self-pay | Admitting: Emergency Medicine

## 2014-12-12 ENCOUNTER — Emergency Department (HOSPITAL_COMMUNITY): Payer: Self-pay

## 2014-12-12 DIAGNOSIS — K219 Gastro-esophageal reflux disease without esophagitis: Secondary | ICD-10-CM | POA: Insufficient documentation

## 2014-12-12 DIAGNOSIS — Z8679 Personal history of other diseases of the circulatory system: Secondary | ICD-10-CM | POA: Insufficient documentation

## 2014-12-12 DIAGNOSIS — F329 Major depressive disorder, single episode, unspecified: Secondary | ICD-10-CM | POA: Insufficient documentation

## 2014-12-12 DIAGNOSIS — Z87442 Personal history of urinary calculi: Secondary | ICD-10-CM | POA: Insufficient documentation

## 2014-12-12 DIAGNOSIS — R002 Palpitations: Secondary | ICD-10-CM

## 2014-12-12 DIAGNOSIS — J45909 Unspecified asthma, uncomplicated: Secondary | ICD-10-CM | POA: Insufficient documentation

## 2014-12-12 DIAGNOSIS — F419 Anxiety disorder, unspecified: Secondary | ICD-10-CM | POA: Insufficient documentation

## 2014-12-12 LAB — CBC
HCT: 41.1 % (ref 36.0–46.0)
HEMOGLOBIN: 13.4 g/dL (ref 12.0–15.0)
MCH: 28.1 pg (ref 26.0–34.0)
MCHC: 32.6 g/dL (ref 30.0–36.0)
MCV: 86.2 fL (ref 78.0–100.0)
Platelets: 275 10*3/uL (ref 150–400)
RBC: 4.77 MIL/uL (ref 3.87–5.11)
RDW: 13.9 % (ref 11.5–15.5)
WBC: 13.7 10*3/uL — AB (ref 4.0–10.5)

## 2014-12-12 LAB — I-STAT TROPONIN, ED: TROPONIN I, POC: 0 ng/mL (ref 0.00–0.08)

## 2014-12-12 LAB — BASIC METABOLIC PANEL
ANION GAP: 3 — AB (ref 5–15)
BUN: 13 mg/dL (ref 6–23)
CALCIUM: 8.6 mg/dL (ref 8.4–10.5)
CO2: 25 mmol/L (ref 19–32)
Chloride: 108 mmol/L (ref 96–112)
Creatinine, Ser: 0.93 mg/dL (ref 0.50–1.10)
GFR calc Af Amer: 82 mL/min — ABNORMAL LOW (ref >90)
GFR calc non Af Amer: 71 mL/min — ABNORMAL LOW (ref >90)
Glucose, Bld: 99 mg/dL (ref 70–99)
POTASSIUM: 3.6 mmol/L (ref 3.5–5.1)
Sodium: 136 mmol/L (ref 135–145)

## 2014-12-12 NOTE — ED Notes (Signed)
Pt c/o waking up with heart racing and "a weird breathing sound like a cackle". Pt sts this only happens when she is asleep and it wakes her up in the middle of the night. Pt c/o slight chest tightness on the L side at this time. No other complaints, denies SOB, N/V, radiating pain at this time. A&Ox4 and ambulatory. Pt sts this has been going on for a week and she has an appointment to see a cardiologist.

## 2014-12-13 MED ORDER — PANTOPRAZOLE SODIUM 40 MG PO TBEC
80.0000 mg | DELAYED_RELEASE_TABLET | Freq: Once | ORAL | Status: AC
  Administered 2014-12-13: 80 mg via ORAL
  Filled 2014-12-13: qty 2

## 2014-12-13 MED ORDER — OMEPRAZOLE 20 MG PO CPDR: 20.0000 mg | DELAYED_RELEASE_CAPSULE | Freq: Every day | ORAL | Status: DC

## 2014-12-13 NOTE — ED Provider Notes (Signed)
CSN: [id number]     Arrival date & time 12/12/14  2212 History   First MD Initiated Contact with Patient 12/12/14 2230     Chief Complaint  Patient presents with  . Palpitations     (Consider location/radiation/quality/duration/timing/severity/associated sxs/prior Treatment) Patient is a 49 y.o. female presenting with palpitations. The history is provided by the patient and medical records. No language interpreter was used.  Palpitations Associated symptoms: no back pain, no chest pain, no cough, no diaphoresis, no nausea, no shortness of breath and no vomiting      Erica Zhang is a 49 y.o. female  with a hx of asthma, kidney stones, depression, anxiety presents to the Emergency Department complaining of intermittent episodes of palpitations and strange breathing. She reports it happened for the first time approximately one week ago. She reports that it woke her from sleep and caused her to have a panic attack. She reports it has happened 4 or 5 more times since that initial night. She reports it happened tonight and she was afraid to go back to sleep. She reports that she sleeps in bed with a partner he denies symptoms of sleep apnea. She reports that she does not stop breathing but has a strain sensation in the back of her throat and she makes a strange noise before it waking her. She reports after she sits up she notices the palpitations. She denies chest pain or shortness of breath during these episodes. She denies feelings of choking. She denies other associated symptoms. Denies fever, chills, headache, neck pain, chest pain or shortness of breath, abdominal pain, nausea, vomiting, diarrhea, weakness, dizziness, syncope. Patient reports that she had a minor choking episode earlier in the day of the first episode and is concerned this may be part of her symptoms. Patient also reports a history of GERD but is not currently being treated.  She also reports one week of persistent cough, worse  at night.   Past Medical History  Diagnosis Date  . Asthma   . Kidney stone   . Depression   . Allergy   . Anxiety   . Mitral valve disorder    Past Surgical History  Procedure Laterality Date  . Tonsillectomy    . Dnc     Family History  Problem Relation Age of Onset  . Adopted: Yes   History  Substance Use Topics  . Smoking status: Never Smoker   . Smokeless tobacco: Never Used  . Alcohol Use: 0.0 oz/week    0 Standard drinks or equivalent per week   OB History    No data available     Review of Systems  Constitutional: Negative for fever, diaphoresis, appetite change, fatigue and unexpected weight change.  HENT: Negative for mouth sores.   Eyes: Negative for visual disturbance.  Respiratory: Negative for cough, chest tightness, shortness of breath and wheezing.   Cardiovascular: Positive for palpitations. Negative for chest pain.  Gastrointestinal: Negative for nausea, vomiting, abdominal pain, diarrhea and constipation.  Endocrine: Negative for polydipsia, polyphagia and polyuria.  Genitourinary: Negative for dysuria, urgency, frequency and hematuria.  Musculoskeletal: Negative for back pain and neck stiffness.  Skin: Negative for rash.  Allergic/Immunologic: Negative for immunocompromised state.  Neurological: Negative for syncope, light-headedness and headaches.  Hematological: Does not bruise/bleed easily.  Psychiatric/Behavioral: Negative for sleep disturbance. The patient is not nervous/anxious.       Allergies  Aspirin; Ibuprofen; and Naproxen sodium  Home Medications   Prior to Admission medications  Medication Sig Start Date End Date Taking? Authorizing Provider  sertraline (ZOLOFT) 100 MG tablet Take 1 tablet (100 mg total) by mouth at bedtime. PATIENT NEEDS OFFICE VISIT FOR ADDITIONAL REFILLS 06/11/13  Yes Carmelina Dane, MD  LORazepam (ATIVAN) 1 MG tablet Take 1 tablet (1 mg total) by mouth 2 (two) times daily as needed for  anxiety. Patient not taking: Reported on 12/12/2014 11/18/14   Ofilia Neas, PA-C  omeprazole (PRILOSEC) 20 MG capsule Take 1 capsule (20 mg total) by mouth daily. 12/13/14   Ohana Birdwell, PA-C   BP 134/86 mmHg  Pulse 79  Temp(Src) 98.3 F (36.8 C) (Oral)  Resp 18  SpO2 98%  LMP 11/15/2014 Physical Exam  Constitutional: She is oriented to person, place, and time. She appears well-developed and well-nourished. No distress.  Awake, alert, nontoxic appearance  HENT:  Head: Normocephalic and atraumatic.  Right Ear: Tympanic membrane, external ear and ear canal normal.  Left Ear: Tympanic membrane, external ear and ear canal normal.  Nose: No mucosal edema or rhinorrhea. No epistaxis. Right sinus exhibits no maxillary sinus tenderness and no frontal sinus tenderness. Left sinus exhibits no maxillary sinus tenderness and no frontal sinus tenderness.  Mouth/Throat: Uvula is midline, oropharynx is clear and moist and mucous membranes are normal. Mucous membranes are not pale and not cyanotic. No oropharyngeal exudate, posterior oropharyngeal edema, posterior oropharyngeal erythema or tonsillar abscesses.  No swelling of the uvula No erythema of the throat Posterior oropharynx with previous tonsillectomy  Eyes: Conjunctivae are normal. Pupils are equal, round, and reactive to light. No scleral icterus.  Neck: Normal range of motion and full passive range of motion without pain. Neck supple.  Cardiovascular: Normal rate, regular rhythm, normal heart sounds and intact distal pulses.  Exam reveals no gallop and no friction rub.   No murmur heard. Pulmonary/Chest: Effort normal and breath sounds normal. No stridor. No respiratory distress. She has no wheezes.  Equal chest expansion  Abdominal: Soft. Bowel sounds are normal. She exhibits no distension and no mass. There is no tenderness. There is no rebound and no guarding.  Abdomen soft and nontender  Musculoskeletal: Normal range of motion.  She exhibits no edema.  Lymphadenopathy:    She has no cervical adenopathy.  Neurological: She is alert and oriented to person, place, and time.  Speech is clear and goal oriented Moves extremities without ataxia  Skin: Skin is warm and dry. No rash noted. She is not diaphoretic.  Psychiatric: She has a normal mood and affect.  Nursing note and vitals reviewed.   ED Course  Procedures (including critical care time) Labs Review Labs Reviewed  CBC - Abnormal; Notable for the following:    WBC 13.7 (*)    All other components within normal limits  BASIC METABOLIC PANEL - Abnormal; Notable for the following:    GFR calc non Af Amer 71 (*)    GFR calc Af Amer 82 (*)    Anion gap 3 (*)    All other components within normal limits  I-STAT TROPOININ, ED    Imaging Review Dg Chest 2 View  12/12/2014   CLINICAL DATA:  Cough for 1 week, worsening.  Palpitations.  EXAM: CHEST  2 VIEW  COMPARISON:  08/26/2011  FINDINGS: The cardiomediastinal silhouette is within normal limits. Right infrahilar density on the prior study has resolved. No airspace consolidation, edema, pleural effusion, or pneumothorax is identified on the current study. No acute osseous abnormality is seen.  IMPRESSION: No active  cardiopulmonary disease.   Electronically Signed   By: Sebastian Ache   On: 12/12/2014 23:59     EKG Interpretation   Date/Time:  Sunday December 12 2014 22:25:08 EDT Ventricular Rate:  77 PR Interval:  133 QRS Duration: 96 QT Interval:  399 QTC Calculation: 452 R Axis:   45 Text Interpretation:  Sinus rhythm Low voltage, precordial leads Baseline  wander in lead(s) I II aVR aVL aVF Confirmed by Lincoln Brigham [phone]) on  12/12/2014 10:52:16 PM      MDM   Final diagnoses:  Palpitations  Gastroesophageal reflux disease, esophagitis presence not specified   Ernst Bowler Currie presents with complaints of palpitations and fear of falling asleep.  Patient has an appointment to see a cardiologist on  May 2.  Concern for sleep apnea versus refluxing and night versus cardiac event.  12:33 AM Patient with mild leukocytosis. Labs otherwise reassuring.  Troponin negative, EKG unremarkable. Chest x-ray without evidence of pneumonia.  Patient also endorses persistent cough, hoarse voice. In addition to her described symptoms at night with lying flat I believe she is likely experiencing GERD. Recommend ureters and throat follow-up, primary care follow-up for sleep studies. Patient will be started on PPI tonight.  I have personally reviewed patient's vitals, nursing note and any pertinent labs or imaging.  I performed an undressed physical exam.    It has been determined that no acute conditions requiring further emergency intervention are present at this time. The patient/guardian have been advised of the diagnosis and plan. I reviewed all labs and imaging including any potential incidental findings. We have discussed signs and symptoms that warrant return to the ED and they are listed in the discharge instructions.    Vital signs are stable at discharge.   BP 134/86 mmHg  Pulse 79  Temp(Src) 98.3 F (36.8 C) (Oral)  Resp 18  SpO2 98%  LMP 11/15/2014     Dierdre Forth, PA-C 12/13/14 6045  Mirian Mo, MD 12/16/14 [phone]

## 2014-12-13 NOTE — Discharge Instructions (Signed)
1. Medications: omeprazole, usual home medications 2. Treatment: rest, drink plenty of fluids, no eating or drinking 2 hours before bed 3. Follow Up: Please followup with your primary doctor in 3 days and ENT this week for discussion of your diagnoses and further evaluation after today's visit; if you do not have a primary care doctor use the resource guide provided to find one; Please return to the ER for worsening symptoms  Gastroesophageal Reflux Disease, Adult Gastroesophageal reflux disease (GERD) happens when acid from your stomach flows up into the esophagus. When acid comes in contact with the esophagus, the acid causes soreness (inflammation) in the esophagus. Over time, GERD may create small holes (ulcers) in the lining of the esophagus. CAUSES   Increased body weight. This puts pressure on the stomach, making acid rise from the stomach into the esophagus.  Smoking. This increases acid production in the stomach.  Drinking alcohol. This causes decreased pressure in the lower esophageal sphincter (valve or ring of muscle between the esophagus and stomach), allowing acid from the stomach into the esophagus.  Late evening meals and a full stomach. This increases pressure and acid production in the stomach.  A malformed lower esophageal sphincter. Sometimes, no cause is found. SYMPTOMS   Burning pain in the lower part of the mid-chest behind the breastbone and in the mid-stomach area. This may occur twice a week or more often.  Trouble swallowing.  Sore throat.  Dry cough.  Asthma-like symptoms including chest tightness, shortness of breath, or wheezing. DIAGNOSIS  Your caregiver may be able to diagnose GERD based on your symptoms. In some cases, X-rays and other tests may be done to check for complications or to check the condition of your stomach and esophagus. TREATMENT  Your caregiver may recommend over-the-counter or prescription medicines to help decrease acid production.  Ask your caregiver before starting or adding any new medicines.  HOME CARE INSTRUCTIONS   Change the factors that you can control. Ask your caregiver for guidance concerning weight loss, quitting smoking, and alcohol consumption.  Avoid foods and drinks that make your symptoms worse, such as:  Caffeine or alcoholic drinks.  Chocolate.  Peppermint or mint flavorings.  Garlic and onions.  Spicy foods.  Citrus fruits, such as oranges, lemons, or limes.  Tomato-based foods such as sauce, chili, salsa, and pizza.  Fried and fatty foods.  Avoid lying down for the 3 hours prior to your bedtime or prior to taking a nap.  Eat small, frequent meals instead of large meals.  Wear loose-fitting clothing. Do not wear anything tight around your waist that causes pressure on your stomach.  Raise the head of your bed 6 to 8 inches with wood blocks to help you sleep. Extra pillows will not help.  Only take over-the-counter or prescription medicines for pain, discomfort, or fever as directed by your caregiver.  Do not take aspirin, ibuprofen, or other nonsteroidal anti-inflammatory drugs (NSAIDs). SEEK IMMEDIATE MEDICAL CARE IF:   You have pain in your arms, neck, jaw, teeth, or back.  Your pain increases or changes in intensity or duration.  You develop nausea, vomiting, or sweating (diaphoresis).  You develop shortness of breath, or you faint.  Your vomit is green, yellow, black, or looks like coffee grounds or blood.  Your stool is red, bloody, or black. These symptoms could be signs of other problems, such as heart disease, gastric bleeding, or esophageal bleeding. MAKE SURE YOU:   Understand these instructions.  Will watch your condition.  Will get help right away if you are not doing well or get worse. Document Released: 05/23/2005 Document Revised: 11/05/2011 Document Reviewed: 03/02/2011 [facility] [employer] [facility] Patient Information 2015 [place], [place]. This information is not intended  to replace advice given to you by your health care provider. Make sure you discuss any questions you have with your health care provider.   Emergency Department Resource Guide 1) Find a Doctor and Pay Out of Pocket Although you won't have to find out who is covered by your insurance plan, it is a good idea to ask around and get recommendations. You will then need to call the office and see if the doctor you have chosen will accept you as a new patient and what types of options they offer for patients who are self-pay. Some doctors offer discounts or will set up payment plans for their patients who do not have insurance, but you will need to ask so you aren't surprised when you get to your appointment.  2) Contact Your Local Health Department Not all health departments have doctors that can see patients for sick visits, but many do, so it is worth a call to see if yours does. If you don't know where your local health department is, you can check in your phone book. The CDC also has a tool to help you locate your state's health department, and many state websites also have listings of all of their local health departments.  3) Find a Walk-in Clinic If your illness is not likely to be very severe or complicated, you may want to try a walk in clinic. These are popping up all over the country in pharmacies, drugstores, and shopping centers. They're usually staffed by nurse practitioners or physician assistants that have been trained to treat common illnesses and complaints. They're usually fairly quick and inexpensive. However, if you have serious medical issues or chronic medical problems, these are probably not your best option.  No Primary Care Doctor: - Call Health Connect at  [phone] - they can help you locate a primary care doctor that  accepts your insurance, provides certain services, etc. - Physician Referral Service- [phone]  Chronic Pain Problems: Organization         Address  Phone    Notes  [facility]  [phone] Patients need to be referred by their primary care doctor.   Medication Assistance: Organization         Address  Phone   Notes  [facility] Medication [facility] [address] [place] [place], [place] [address] [phone] --Must be a resident of [facility] -- Must have NO insurance coverage whatsoever (no Medicaid/ Medicare, etc.) -- The pt. MUST have a primary care doctor that directs their care regularly and follows them in the community   [facility]  [phone]   [employer]  [phone]    Agencies that provide inexpensive medical care: Organization         Address  Phone   Notes  [facility]  [phone]   [facility]    [phone]   [facility] [address] [place], [place] [address] [phone]   [facility] [address], [place] [phone]   [facility]anned Parenthood    [phone]   [facility]    [phone]   [facility]  [address],  Phone:  [phone],  Fax:  [phone] Hours of Operation:  9 am - 6 pm, M-F.  Also accepts Medicaid/Medicare and self-pay.  [facility]  [address], [place] Phone: [phone], Fax: [phone]. Hours of Operation:  8:30 am - 5:30 pm, M-F.  Also accepts Medicaid and self-pay.  [facility] [place] [address] [place] Phone: [phone]   [facility] [address] Natasha Bence [place], [place] [phone], Ext. [phone] Mondays & Thursdays: 7-9 AM.  First 15 patients are seen on a first come, first serve basis.    Medicaid-accepting [facility] Providers:  Organization         Address  Phone   Notes  [facility] [address], [place] [phone] Also accepts self-pay patients.  [facility]  [address] Laurell Josephs [place], [place]  [phone]   [facility] [address], [place] [phone]   [facility] [address], [place] [phone]   Renaye Rakers [address], [place]   [phone] Only accepts [place] Access [place] patients after they have their name applied to their card.   Self-Pay (no insurance) in [facility]:  Organization         Address  Phone   Notes  Sickle Cell Patients, [facility] [address] [place], [place] [phone]   [facility] [address] [place], [place] [phone]   [facility] [place]  [address], [place] [phone]   [facility]/Dr. Osei-Bonsu  [address] [place] or [address], [place] [phone] Phone number for both [place] and [place] locations is the same.  [facility] [address] [place] [phone]   [facility] [address] [place] or [address] [phone]   [facility] [address], [place] [phone], phone; [phone], fax Sees patients 1st and 3rd Saturday of every month.  Must not qualify for public or private insurance (i.e. Medicaid, Medicare, [place] Health Choice, Veterans' Benefits)  Household income should be no more than 200% of the poverty level The clinic cannot treat you if you are pregnant or think you are pregnant  Sexually transmitted diseases are not treated at the clinic.    Dental Care: Organization         Address  Phone  Notes  [facility] Department of [facility] [address] [place], [place] [phone] Accepts children up to age 39 who are enrolled in [place] or [place] Health Choice; pregnant women with a Medicaid card; and children who have  applied for Medicaid or [place] Health Choice, but were declined, whose parents can pay a reduced fee at time of service.  [facility]  [address], [place] [phone] Accepts children up to age 13 who are enrolled in [place] or [place] Health Choice; pregnant women with a Medicaid card; and children who have applied for Medicaid or [place] Health Choice, but were declined, whose parents can pay a reduced fee at time of service.  [facility]  [address] [place], [place] [phone] Patients are seen by appointment only. Walk-ins are not  accepted. [facility] will see patients 50 years of age and older. Monday - Tuesday (8am-5pm) Most Wednesdays (8:30-5pm) $30 per visit, cash only  [facility]  [address], [facility] [phone] Patients are seen by appointment only. Walk-ins are not accepted. [facility] will see patients 50 years of age and older. One Wednesday Evening (Monthly: Volunteer Based).  $30 per visit, cash only  [facility]  [phone] for adults; Children under age 50, call Graduate Pediatric Dentistry at [phone]. Children aged 534-14, please call [phone] to request a pediatric application.  Dental services are provided in all areas of dental care including fillings, crowns and bridges, complete and partial dentures, implants, gum treatment, root canals, and extractions. Preventive care is also provided. Treatment is provided to both adults and children. Patients are selected via a lottery and there is often a waiting list.   [facility] [address], [place]  [phone] [email or web address]   [facility] [address], [place], [place] [phone], Ext. [phone] Second and Fourth Thursday of each month, opens at 6:30 AM; Clinic ends at 9 AM.  Patients are seen on a first-come first-served basis, and a  limited number are seen during each clinic.   [facility]  [address], [place], [place] [phone]   Eligibility Requirements You must have lived in [place], [place], or [place] counties for at least the last three months.   You cannot be eligible for state or federal sponsored [place]healthcare insurance, including CIGNAVeterans Administration, [place]icaid, or [employer]icare.   You generally cannot be eligible for healthcare insurance through your employer.    How to apply: Eligibility screenings are held every Tuesday and Wednesday afternoon from 1:00 pm until 4:00 pm. You do not need an appointment for the interview!  [facility] [address], [place], [place] [phone]   [facility]  [phone]   [facility]  [phone]   [facility]  [phone]    Behavioral Health Resources in the Community: Intensive Outpatient Programs Organization         Address  Phone  Notes  [facility] [address], [place], [place] [phone]   [facility] [address], [place], [place] [phone]   [facility] [address], [place], [place]  [phone]   [facility] [address],  [place], [place] [phone] or [phone]   Substance Abuse Resources Organization         Address  Phone  Notes  [facility]  [phone]   [facility]  [phone]   [facility]  [phone]   [facility]  [phone]   Residential & Outpatient Substance Abuse Program  [phone]   Psychological Services Organization         Address  Phone  Notes  [facility]  [phone]   [facility]  [phone]- [phone]   [facility] [address], [place] [phone] or [phone]    Mobile Crisis Teams Organization          Address  Phone  Notes  [facility], Mobile Crisis Care Unit  [phone]   [facility]  [address] [place], [place] [phone]   [facility] [address] [place] [phone]    Self-Help/Support Groups  Organization         Address  Phone             Notes  [facility] [place] - variety of support groups  [phone]- [phone] Call for more information  Narcotics Anonymous (NA), [facility] [address], [employer] [place]  2 meetings at this location   Statistician         Address  Phone  Notes  [facility] [address],    [place]  [phone]   [facility]  [address] [place] [address], [place], [place] [phone]   [facility] [address] [place], [place] [phone] Admissions: 8am-3pm M-F  [facility] [address]    [place], [place] [phone]   [facility] [address] [place], [place], [place] [phone]   [facility] [address],  [place], [place] [phone]   [facility] - Intensive Outpatient [address], [place], [place] [phone]   [facility] [address] [place]  [place], [place] [phone] or [phone]   [facility] ([facility]) [address], [place], [place] [phone] Accepts Medicaid  [facility] [address]  [place] [phone] Substance Abuse/Addiction Treatment   [facility] Organization         Address  Phone  Notes  [facility]  [phone]   Angie Fava, PhD [address] Ervin Knack [place], [place]   [phone] or [phone]   [facility]   [address] [place], [place] [phone]   [facility] [address] [place], [place] [phone] Insurance/Medicaid/sponsorship  through [facility] and Families [address]                                    [place], [place] [phone] Therapy/tele-psych/case  [facility] [address], [place] [phone]    Dr. Lolly Mustache  [phone]   [facility]  [employer] [facility] Dept. 1) [address], [place] 2) [address], [place] 3)  [address], [place] [phone]  [phone]   [facility] Child Abuse Hotline 754-886-7336 or [phone] (After Hours)

## 2014-12-24 ENCOUNTER — Ambulatory Visit: Payer: Self-pay | Admitting: Internal Medicine

## 2014-12-27 ENCOUNTER — Ambulatory Visit (INDEPENDENT_AMBULATORY_CARE_PROVIDER_SITE_OTHER): Payer: Self-pay | Admitting: Internal Medicine

## 2014-12-27 ENCOUNTER — Encounter: Payer: Self-pay | Admitting: Internal Medicine

## 2014-12-27 VITALS — BP 128/86 | HR 65 | Ht 66.0 in | Wt 247.4 lb

## 2014-12-27 DIAGNOSIS — R0683 Snoring: Secondary | ICD-10-CM

## 2014-12-27 DIAGNOSIS — G4719 Other hypersomnia: Secondary | ICD-10-CM

## 2014-12-27 DIAGNOSIS — Z8659 Personal history of other mental and behavioral disorders: Secondary | ICD-10-CM

## 2014-12-27 DIAGNOSIS — R002 Palpitations: Secondary | ICD-10-CM

## 2014-12-27 NOTE — Patient Instructions (Addendum)
Your physician has recommended that you wear an event monitor. Event monitors are medical devices that record the heart's electrical activity. Doctors most often us these monitors to diagnose arrhythmias. Arrhythmias are problems with the speed or rhythm of the heartbeat. The monitor is a small, portable device. You can wear one while you do your normal daily activities. This is usually used to diagnose what is causing palpitations/syncope (passing out). >> you will wear this for 1 week  >> to access patient assistance go to [email or web address] and click on the "Patients" tab. Click on "Financial Assistance." Fill out the form and submit application. If approved, they will mail you the monitor.  Your physician has recommended that you have a sleep study. This test records several body functions during sleep, including: brain activity, eye movement, oxygen and carbon dioxide blood levels, heart rate and rhythm, breathing rate and rhythm, the flow of air through your mouth and nose, snoring, body muscle movements, and chest and belly movement. >> this [facility] at [facility]  Your physician recommends that you schedule a follow-up appointment after your tests.

## 2014-12-28 NOTE — Progress Notes (Signed)
OFFICE NOTE  Chief Complaint:  Palpitations  Primary Care Physician: Kirstie PeriSHAH,ASHISH, MD  HPI:  Erica Zhang is a 49 year old female who prefers to go by "[name]". She is currently referred to me by Deliah BostonMichael Clark, PA-C for heart fluttering. She's had these episodes were heart rate goes up at night while lying down. Sometimes it does occurring during the day however it's been worse at night for the past month. She was seen in the emergency department for those symptoms and workup was negative. Was thought that some of her symptoms could be due to reflux although she denied any bad taste in her mouth, acid or burning feeling or indigestion. She's been taking Prilosec for over a week without any improvement. She denies any chest pain or worsening shortness of breath with exertion. She is known to snore at night and does have excessive daytime sleepiness. Her EPWSS score was 13, concerning for possible sleep disorder.  PMHx:  Past Medical History  Diagnosis Date  . Asthma   . Kidney stone   . Depression   . Allergy   . Anxiety   . Mitral valve disorder     Past Surgical History  Procedure Laterality Date  . Tonsillectomy    . Dnc      FAMHx:  Family History  Problem Relation Age of Onset  . Adopted: Yes    SOCHx:   reports that she has never smoked. She has never used smokeless tobacco. She reports that she drinks about 0.6 oz of alcohol per week. She reports that she does not use illicit drugs.  ALLERGIES:  Allergies  Allergen Reactions  . Aspirin Anaphylaxis  . Ibuprofen Anaphylaxis  . Naproxen Sodium Anaphylaxis    ROS: A comprehensive review of systems was negative except for: Constitutional: positive for Excessive daytime sleepiness Cardiovascular: positive for irregular heart beat  HOME MEDS: Current Outpatient Prescriptions  Medication Sig Dispense Refill  . omeprazole (PRILOSEC) 20 MG capsule Take 20 mg by mouth daily as needed.    . sertraline (ZOLOFT)  100 MG tablet Take 1 tablet (100 mg total) by mouth at bedtime. PATIENT NEEDS OFFICE VISIT FOR ADDITIONAL REFILLS 30 tablet 0   No current facility-administered medications for this visit.    LABS/IMAGING: No results found for this or any previous visit (from the past 48 hour(s)). No results found.  WEIGHTS: Wt Readings from Last 3 Encounters:  12/27/14 247 lb 6.4 oz (112.22 kg)  11/18/14 247 lb (112.038 kg)  01/18/13 230 lb (104.327 kg)    VITALS: BP 128/86 mmHg  Pulse 65  Ht 5\' 6"  (1.676 m)  Wt 247 lb 6.4 oz (112.22 kg)  BMI 39.95 kg/m2  LMP 11/15/2014  EXAM: General appearance: alert and no distress Neck: no carotid bruit and no JVD Lungs: clear to auscultation bilaterally Heart: regular rate and rhythm, S1, S2 normal, no murmur, click, rub or gallop Abdomen: soft, non-tender; bowel sounds normal; no masses,  no organomegaly Extremities: extremities normal, atraumatic, no cyanosis or edema Pulses: 2+ and symmetric Skin: Skin color, texture, turgor normal. No rashes or lesions Neurologic: Grossly normal Psych: Mildly anxious  EKG: Normal sinus rhythm at 65  ASSESSMENT: 1. Recurrent palpitations, predominantly nocturnal 2. High EPWSS score of 13  PLAN: 1.   Erica Zhang is describing palpitations which are predominantly nocturnal. Given her high sleepiness score, I'm concerned about sleep apnea. I like to refer her for a sleep study and also place a one-week monitor to see if we  can further identify her palpitations. Plan to see her back to discuss the findings in a few weeks.  Thanks for the kind referral.  Chrystie Nose, MD, [facility] Attending Cardiologist [facility]  Chrystie Nose 12/28/2014, 4:22 PM

## 2014-12-29 ENCOUNTER — Encounter: Payer: Self-pay | Admitting: *Deleted

## 2015-02-15 ENCOUNTER — Telehealth: Payer: Self-pay | Admitting: Internal Medicine

## 2015-02-15 NOTE — Telephone Encounter (Signed)
Spoke with dentist office. Patient had dental extraction, dental work 7-10 days ago and was properly medicated for SBE. Dentist does not want to get more abx, and informed her that we will not prescribe abx - no echo report on file denoting that patient has reported MVP that would warrant SBE prophylaxis.   Left message for patient with this information.

## 2015-02-15 NOTE — Telephone Encounter (Signed)
Erica Zhang is calling to see if she needs to placed on post surgery antibiotic from having a tooth pulled and one filled and she is having some papillations. Please call    Thanks

## 2015-02-15 NOTE — Telephone Encounter (Signed)
Spoke with patient. She reports she had oral surgery - tooth extracted - and filling done 3 days ago by Dr. Lafayette Dragon. She reports dentist gave her pre-meds for her MVP (amoxicillin). Patient reports she has not had palpitations/skipping beats in a few weeks and it occurred last nite so it scared her and she called her dentist to inquire if she would need more abx post-procedure. Informed patient taking extra round of SBE prophylaxis would not hurt if patient is concerned about her heart. She was also instructed to contact [employer] regarding her monitor - when I looked it up, it stated "in scheduling" for the status.

## 2015-03-03 ENCOUNTER — Encounter (HOSPITAL_BASED_OUTPATIENT_CLINIC_OR_DEPARTMENT_OTHER): Payer: Self-pay

## 2015-04-27 ENCOUNTER — Telehealth: Payer: Self-pay

## 2015-04-27 NOTE — Telephone Encounter (Signed)
LEFT MESSAGE FOR PATIENT TO RETURN PHONE CALL [facility]

## 2015-04-29 ENCOUNTER — Telehealth: Payer: Self-pay

## 2015-04-29 NOTE — Telephone Encounter (Signed)
I SPOKE WITH PATIENT SHE CURRENTLY DOES NOT HAVE ANY HEALTH INSURANCE [facility]

## 2015-04-29 NOTE — Telephone Encounter (Signed)
LEFT V/M MESSAGE FOR PATIENT  TO RETURN CALL IN ORDER TO VERIFY WHAT TYPE INSURANCE PATIENT HAS

## 2015-05-18 ENCOUNTER — Encounter (HOSPITAL_BASED_OUTPATIENT_CLINIC_OR_DEPARTMENT_OTHER): Payer: Self-pay

## 2015-06-22 ENCOUNTER — Ambulatory Visit (INDEPENDENT_AMBULATORY_CARE_PROVIDER_SITE_OTHER): Payer: Self-pay | Admitting: Emergency Medicine

## 2015-06-22 VITALS — BP 122/72 | HR 120 | Temp 98.2°F | Resp 17 | Ht 66.5 in | Wt 245.0 lb

## 2015-06-22 DIAGNOSIS — J0140 Acute pansinusitis, unspecified: Secondary | ICD-10-CM

## 2015-06-22 DIAGNOSIS — J209 Acute bronchitis, unspecified: Secondary | ICD-10-CM

## 2015-06-22 MED ORDER — HYDROCOD POLST-CPM POLST ER 10-8 MG/5ML PO SUER: 5.0000 mL | Freq: Two times a day (BID) | ORAL | Status: DC

## 2015-06-22 MED ORDER — AMOXICILLIN-POT CLAVULANATE 875-125 MG PO TABS: 1.0000 | ORAL_TABLET | Freq: Two times a day (BID) | ORAL | Status: DC

## 2015-06-22 MED ORDER — PSEUDOEPHEDRINE-GUAIFENESIN ER 60-600 MG PO TB12: 1.0000 | ORAL_TABLET | Freq: Two times a day (BID) | ORAL | Status: AC

## 2015-06-22 NOTE — Progress Notes (Signed)
Subjective:  Patient ID: Erica Zhang, female    DOB: [birth year]  Age: 49 y.o. MRN: [id number]  CC: Cough; Sore Throat; and URI   HPI Erica Zhang presents  with acute illness. She has nasal congestion pressure cheeks and for it. She has postnasal nasal discharge is purulent collar. She has a fever and chills over the weekend. She has no nausea or vomiting. She has a cough productive of purulent sputum. She has no wheezing or shortness of breath. She has no stool change or rash she's had no improvement with over-the-counter medication.  History Erica Zhang has a past medical history of Asthma; Kidney stone; Depression; Allergy; Anxiety; and Mitral valve disorder.   She has past surgical history that [facility] (1990[facility]; and Kidney [facility] ([facility]).   Her  family history is not on file. She was adopted.  She   reports that she has never smoked. She has never used smokeless tobacco. She reports that she drinks about 0.6 oz of alcohol per week. She reports that she does not use illicit drugs.  Outpatient Prescriptions Prior to Visit  Medication Sig Dispense Refill  . sertraline (ZOLOFT) 100 MG tablet Take 1 tablet (100 mg total) by mouth at bedtime. PATIENT NEEDS OFFICE VISIT FOR ADDITIONAL REFILLS 30 tablet 0  . omeprazole (PRILOSEC) 20 MG capsule Take 20 mg by mouth daily as needed.     No facility-administered medications prior to visit.    Social History   Social History  . Marital Status: Divorced    Spouse Name: N/A  . Number of Children: 1  . Years of Education: Ass.    Occupational History  . [employer] architectrestaurant manager    Social History Main Topics  . Smoking status: Never Smoker   . Smokeless tobacco: Never Used  . Alcohol Use: 0.6 oz/week    1 Standard drinks or equivalent per week  . Drug Use: No  . Sexual Activity: No   Other Topics Concern  . None   Social History Narrative     Review of Systems  Constitutional: Positive for  fever and chills. Negative for appetite change.  HENT: Positive for congestion, postnasal drip, sinus pressure and sore throat. Negative for ear pain.   Eyes: Negative for pain and redness.  Respiratory: Positive for cough. Negative for shortness of breath and wheezing.   Cardiovascular: Negative for leg swelling.  Gastrointestinal: Negative for nausea, vomiting, abdominal pain, diarrhea, constipation and blood in stool.  Endocrine: Negative for polyuria.  Genitourinary: Negative for dysuria, urgency, frequency and flank pain.  Musculoskeletal: Negative for gait problem.  Skin: Negative for rash.  Neurological: Negative for weakness and headaches.  Psychiatric/Behavioral: Negative for confusion and decreased concentration. The patient is not nervous/anxious.     Objective:  BP 122/72 mmHg  Pulse 120  Temp(Src) 98.2 F (36.8 C) (Oral)  Resp 17  Ht 5' 6.5" (1.689 m)  Wt 245 lb (111.131 kg)  BMI 38.96 kg/m2  SpO2 97%  LMP 06/03/2015  Physical Exam  Constitutional: She is oriented to person, place, and time. She appears well-developed and well-nourished. No distress.  HENT:  Head: Normocephalic and atraumatic.  Right Ear: External ear normal.  Left Ear: External ear normal.  Nose: Nose normal.  Eyes: Conjunctivae and EOM are normal. Pupils are equal, round, and reactive to light. No scleral icterus.  Neck: Normal range of motion. Neck supple. No tracheal deviation present.  Cardiovascular: Normal rate, regular rhythm and normal heart sounds.  Pulmonary/Chest: Effort normal. No respiratory distress. She has no wheezes. She has no rales.  Abdominal: She exhibits no mass. There is no tenderness. There is no rebound and no guarding.  Musculoskeletal: She exhibits no edema.  Lymphadenopathy:    She has no cervical adenopathy.  Neurological: She is alert and oriented to person, place, and time. Coordination normal.  Skin: Skin is warm and dry. No rash noted.  Psychiatric: She has a  normal mood and affect. Her behavior is normal.      Assessment & Plan:   Erica Zhang was seen today for cough, sore throat and uri.  Diagnoses and all orders for this visit:  Acute bronchitis, unspecified organism  Acute pansinusitis, recurrence not specified  Other orders -     amoxicillin-clavulanate (AUGMENTIN) 875-125 MG tablet; Take 1 tablet by mouth 2 (two) times daily. -     pseudoephedrine-guaifenesin (MUCINEX D) 60-600 MG 12 hr tablet; Take 1 tablet by mouth every 12 (twelve) hours. -     chlorpheniramine-HYDROcodone (TUSSIONEX PENNKINETIC ER) 10-8 MG/5ML SUER; Take 5 mLs by mouth 2 (two) times daily.  I have discontinued Ms. Crego's omeprazole. I am also having her start on amoxicillin-clavulanate, pseudoephedrine-guaifenesin, and chlorpheniramine-HYDROcodone. Additionally, I am having her maintain her sertraline.  Meds ordered this encounter  Medications  . amoxicillin-clavulanate (AUGMENTIN) 875-125 MG tablet    Sig: Take 1 tablet by mouth 2 (two) times daily.    Dispense:  20 tablet    Refill:  0  . pseudoephedrine-guaifenesin (MUCINEX D) 60-600 MG 12 hr tablet    Sig: Take 1 tablet by mouth every 12 (twelve) hours.    Dispense:  18 tablet    Refill:  0  . chlorpheniramine-HYDROcodone (TUSSIONEX PENNKINETIC ER) 10-8 MG/5ML SUER    Sig: Take 5 mLs by mouth 2 (two) times daily.    Dispense:  60 mL    Refill:  0    Appropriate red flag conditions were discussed with the patient as well as actions that should be taken.  Patient expressed his understanding.  Follow-up: Return if symptoms worsen or fail to improve.  Carmelina Dane, MD

## 2015-06-22 NOTE — Patient Instructions (Signed)

## 2015-09-04 IMAGING — CR DG CHEST 2V
2 series · 2 of 2 positions shown · non-contrast
Comparison: 08/26/2011

CLINICAL DATA: Cough for 1 week, worsening.  Palpitations.

EXAM:
CHEST  2 VIEW

[w chest pa]
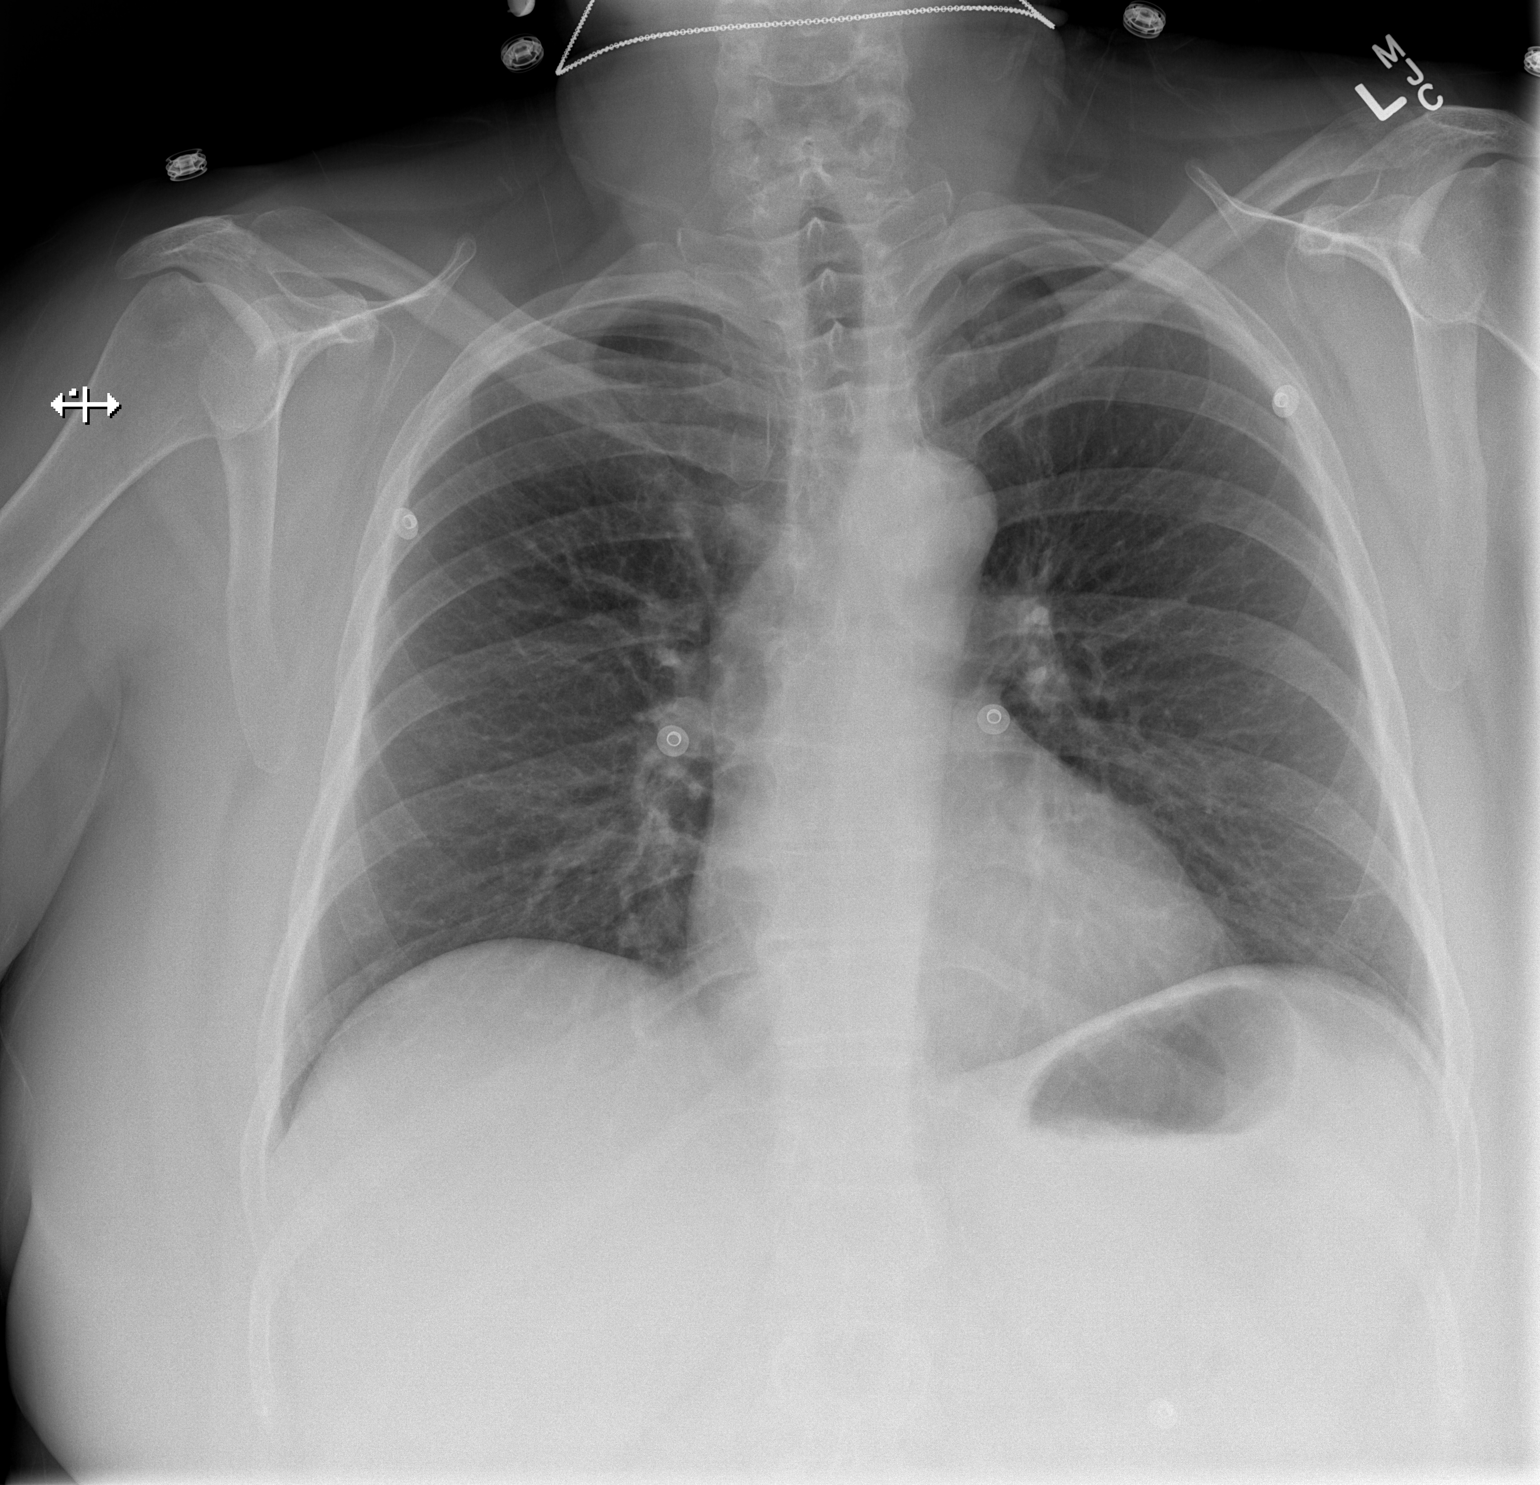

[w chest lat]
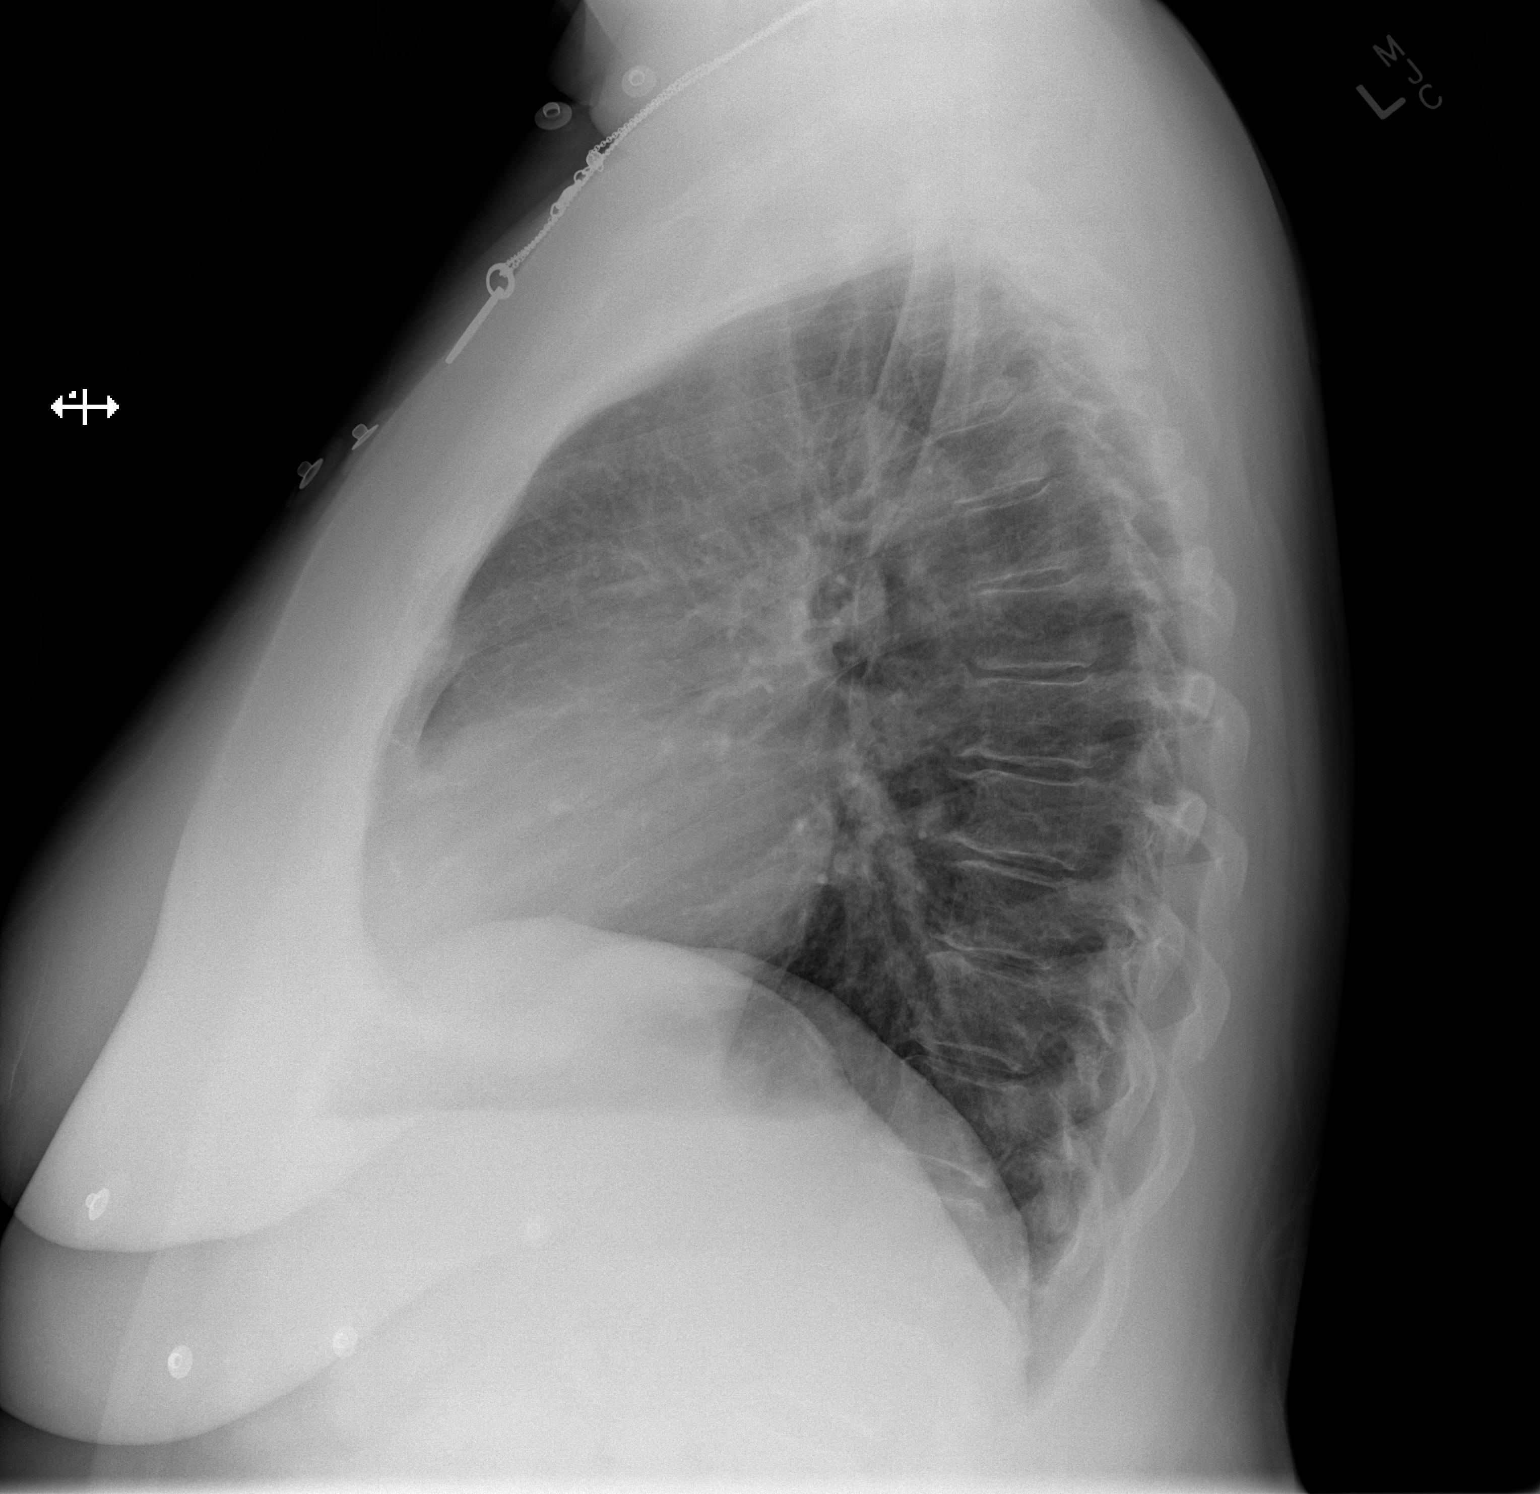

[2 of 2 positions shown; findings below may reference images not displayed]

FINDINGS: The cardiomediastinal silhouette is within normal limits. Right
infrahilar density on the prior study has resolved. No airspace
consolidation, edema, pleural effusion, or pneumothorax is
identified on the current study. No acute osseous abnormality is
seen.
IMPRESSION: No active cardiopulmonary disease.

## 2016-05-12 ENCOUNTER — Encounter (HOSPITAL_COMMUNITY): Payer: Self-pay | Admitting: Emergency Medicine

## 2016-05-12 ENCOUNTER — Emergency Department (HOSPITAL_COMMUNITY): Payer: Self-pay

## 2016-05-12 ENCOUNTER — Emergency Department (HOSPITAL_COMMUNITY)
Admission: EM | Admit: 2016-05-12 | Discharge: 2016-05-12 | Disposition: A | Discharge status: Home / Self Care | Payer: Self-pay | Attending: Emergency Medicine | Admitting: Emergency Medicine

## 2016-05-12 DIAGNOSIS — Z792 Long term (current) use of antibiotics: Secondary | ICD-10-CM | POA: Insufficient documentation

## 2016-05-12 DIAGNOSIS — J45909 Unspecified asthma, uncomplicated: Secondary | ICD-10-CM | POA: Insufficient documentation

## 2016-05-12 DIAGNOSIS — J069 Acute upper respiratory infection, unspecified: Secondary | ICD-10-CM | POA: Insufficient documentation

## 2016-05-12 DIAGNOSIS — Z79899 Other long term (current) drug therapy: Secondary | ICD-10-CM | POA: Insufficient documentation

## 2016-05-12 MED ORDER — BENZONATATE 100 MG PO CAPS: 100.0000 mg | ORAL_CAPSULE | Freq: Three times a day (TID) | ORAL | 0 refills | Status: DC | PRN

## 2016-05-12 MED ORDER — AZITHROMYCIN 250 MG PO TABS: 250.0000 mg | ORAL_TABLET | Freq: Every day | ORAL | 0 refills | Status: DC

## 2016-05-12 NOTE — Discharge Instructions (Signed)
1. Medications: Please take all of your antibiotics until finished!, tessalon for cough, continue usual home medications 2. Treatment: rest, drink plenty of fluids, take tylenol or ibuprofen for fever control if needed 3. Follow Up: Please follow up with your primary doctor for discussion of your diagnoses and further evaluation after today's visit if symptoms persist longer than 10-14 days; Return to the ER for high fevers, difficulty breathing or other concerning symptoms

## 2016-05-12 NOTE — ED Provider Notes (Signed)
[facility]-EMERGENCY DEPT Provider Note   CSN: [id number] Arrival date & time: 05/12/16  1701   By signing my name below, I, Erica Zhang, attest that this documentation has been prepared under the direction and in the presence of non-physician practitioner, Elizabeth Sauer, PA-C. Electronically Signed: Nelwyn Zhang, Scribe. 05/12/2016. 6:56 PM.  History   Chief Complaint Chief Complaint  Patient presents with  . URI   The history is provided by the patient. No language interpreter was used.     HPI Comments:  Erica Zhang is a 50 y.o. female with PMHx of asthma who presents to the Emergency Department complaining of gradually worsening constant unproductive cough beginning 7 days ago. No modifying factors indicated. Pt endorses associated chills, sinus congestion and sore throat. She denies any fever. Pt reports that her son was sick first and she seems to have caught the infection from him. Son was given antibiotics for URI. Patient states she tried OTC cold medication but is getting worse instead of better, seeking her to come to ED for evaluation. Not a smoker.   Past Medical History:  Diagnosis Date  . Allergy   . Anxiety   . Asthma   . Depression   . Kidney stone   . Mitral valve disorder     Patient Active Problem List   Diagnosis Date Noted  . History of panic attacks 11/20/2014  . Palpitations 11/20/2014    Past Surgical History:  Procedure Laterality Date  . DNC    . KIDNEY STONE SURGERY  2004   stone removed from ureter  . TONSILLECTOMY  1990    OB History    No data available       Home Medications    Prior to Admission medications   Medication Sig Start Date End Date Taking? Authorizing Provider  amoxicillin-clavulanate (AUGMENTIN) 875-125 MG tablet Take 1 tablet by mouth 2 (two) times daily. 06/22/15   Carmelina Dane, MD  azithromycin (ZITHROMAX) 250 MG tablet Take 1 tablet (250 mg total) by mouth daily. Take first 2 tablets together, then 1  every day until finished. 05/12/16   Sophiamarie Nease Pilcher Jaimee Corum, PA-C  benzonatate (TESSALON) 100 MG capsule Take 1 capsule (100 mg total) by mouth every 8 (eight) hours as needed for cough. 05/12/16   Chase Picket Jacobey Gura, PA-C  chlorpheniramine-HYDROcodone (TUSSIONEX PENNKINETIC ER) 10-8 MG/5ML SUER Take 5 mLs by mouth 2 (two) times daily. 06/22/15   Carmelina Dane, MD  pseudoephedrine-guaifenesin (MUCINEX D) 60-600 MG 12 hr tablet Take 1 tablet by mouth every 12 (twelve) hours. 06/22/15 06/21/16  Carmelina Dane, MD  sertraline (ZOLOFT) 100 MG tablet Take 1 tablet (100 mg total) by mouth at bedtime. PATIENT NEEDS OFFICE VISIT FOR ADDITIONAL REFILLS 06/11/13   Carmelina Dane, MD    Family History Family History  Problem Relation Age of Onset  . Adopted: Yes    Social History Social History  Substance Use Topics  . Smoking status: Never Smoker  . Smokeless tobacco: Never Used  . Alcohol use 0.6 oz/week    1 Standard drinks or equivalent per week     Allergies   Aspirin; Ibuprofen; and Naproxen sodium   Review of Systems Review of Systems  Constitutional: Positive for chills. Negative for fever.  HENT: Positive for congestion and sore throat.   Respiratory: Positive for cough. Negative for shortness of breath.   Cardiovascular: Negative for chest pain.     Physical Exam Updated Vital Signs BP 130/100 (BP Location: Right  Arm)   Pulse 99   Temp 98.8 F (37.1 C) (Oral)   Resp 18   SpO2 100%   Physical Exam  Constitutional: She is oriented to person, place, and time. She appears well-developed and well-nourished. No distress.  HENT:  Head: Normocephalic and atraumatic.  Mouth/Throat: Posterior oropharyngeal erythema present. No oropharyngeal exudate.  No focal sinus tenderness.   Neck: No Brudzinski's sign and no Kernig's sign noted.  Cardiovascular: Normal rate, regular rhythm, normal heart sounds and intact distal pulses.  Exam reveals no gallop and no friction rub.     No murmur heard. Pulmonary/Chest: Effort normal and breath sounds normal. No respiratory distress. She has no wheezes. She has no rales. She exhibits no tenderness.  Lungs clear to auscultation bilaterally.   Musculoskeletal: She exhibits no edema.  Lymphadenopathy:    She has cervical adenopathy.  Neurological: She is alert and oriented to person, place, and time.  Skin: Skin is warm and dry.  Nursing note and vitals reviewed.   ED Treatments / Results  DIAGNOSTIC STUDIES:  Oxygen Saturation is 100% on RA, normal by my interpretation.    COORDINATION OF CARE:  7:44 PM Discussed treatment plan with pt at bedside which included antibiotics and tessalon and pt agreed to plan.  Labs (all labs ordered are listed, but only abnormal results are displayed) Labs Reviewed - No data to display  EKG  EKG Interpretation None       Radiology Dg Chest 2 View  Result Date: 05/12/2016 CLINICAL DATA:  Cough EXAM: CHEST  2 VIEW COMPARISON:  12/12/2014 chest radiograph. FINDINGS: Stable cardiomediastinal silhouette with normal heart size. No pneumothorax. No pleural effusion. Lungs appear clear, with no acute consolidative airspace disease and no pulmonary edema. IMPRESSION: No active cardiopulmonary disease. Electronically Signed   By: Delbert Phenix M.D.   On: 05/12/2016 18:00    Procedures Procedures (including critical care time)  Medications Ordered in ED Medications - No data to display   Initial Impression / Assessment and Plan / ED Course  I have reviewed the triage vital signs and the nursing notes.  Pertinent labs & imaging results that were available during my care of the patient were reviewed by me and considered in my medical decision making (see chart for details).  Clinical Course     Final Clinical Impressions(s) / ED Diagnoses   Final diagnoses:  URI (upper respiratory infection)    [name] is afebrile, non-toxic appearing with a clear lung exam.  Cough/congestion x 1 week. Patient requesting ABX for symptoms. Discussed the risks and benefits of ABX. Given symptom duration will give z-pack but did inform patient that this is possibly a viral infection that will not respond to ABX and she expressed understanding of this. Declined steroid IM. Return precautions discussed and all questions answered.   Blood pressure 130/100, pulse 99, temperature 98.8 F (37.1 C), temperature source Oral, resp. rate 18, SpO2 100 %.   New Prescriptions Discharge Medication List as of 05/12/2016  7:49 PM    START taking these medications   Details  azithromycin (ZITHROMAX) 250 MG tablet Take 1 tablet (250 mg total) by mouth daily. Take first 2 tablets together, then 1 every day until finished., Starting Sat 05/12/2016, Print    benzonatate (TESSALON) 100 MG capsule Take 1 capsule (100 mg total) by mouth every 8 (eight) hours as needed for cough., Starting Sat 05/12/2016, Print       I personally performed the services described in this  documentation, which was scribed in my presence. The recorded information has been reviewed and is accurate.     [facility]chezJaime Pilcher Havilah Topor, PA-C 05/12/16 2317    Melene Planan Floyd, DO 05/13/16 [phone]

## 2016-05-12 NOTE — ED Triage Notes (Signed)
Per pt, states cold symptoms since Monday-caught from son-cough, fatigue

## 2017-02-13 DIAGNOSIS — M2140 Flat foot [pes planus] (acquired), unspecified foot: Secondary | ICD-10-CM | POA: Insufficient documentation

## 2017-03-06 ENCOUNTER — Encounter (HOSPITAL_COMMUNITY): Payer: Self-pay | Admitting: Emergency Medicine

## 2017-03-06 ENCOUNTER — Emergency Department (HOSPITAL_COMMUNITY): Payer: Self-pay

## 2017-03-06 DIAGNOSIS — R05 Cough: Secondary | ICD-10-CM | POA: Insufficient documentation

## 2017-03-06 DIAGNOSIS — Z79899 Other long term (current) drug therapy: Secondary | ICD-10-CM | POA: Insufficient documentation

## 2017-03-06 DIAGNOSIS — J069 Acute upper respiratory infection, unspecified: Secondary | ICD-10-CM | POA: Insufficient documentation

## 2017-03-06 DIAGNOSIS — J45909 Unspecified asthma, uncomplicated: Secondary | ICD-10-CM | POA: Insufficient documentation

## 2017-03-06 LAB — COMPREHENSIVE METABOLIC PANEL
ALBUMIN: 4.1 g/dL (ref 3.5–5.0)
ALT: 16 U/L (ref 14–54)
AST: 22 U/L (ref 15–41)
Alkaline Phosphatase: 83 U/L (ref 38–126)
Anion gap: 11 (ref 5–15)
BUN: 11 mg/dL (ref 6–20)
CHLORIDE: 103 mmol/L (ref 101–111)
CO2: 24 mmol/L (ref 22–32)
Calcium: 9.6 mg/dL (ref 8.9–10.3)
Creatinine, Ser: 0.88 mg/dL (ref 0.44–1.00)
GFR calc Af Amer: >60 mL/min (ref >60)
GFR calc non Af Amer: >60 mL/min (ref >60)
Glucose, Bld: 119 mg/dL — ABNORMAL HIGH (ref 65–99)
POTASSIUM: 4.4 mmol/L (ref 3.5–5.1)
Sodium: 138 mmol/L (ref 135–145)
Total Bilirubin: 0.3 mg/dL (ref 0.3–1.2)
Total Protein: 7.7 g/dL (ref 6.5–8.1)

## 2017-03-06 LAB — CBC WITH DIFFERENTIAL/PLATELET
Basophils Absolute: 0 10*3/uL (ref 0.0–0.1)
Basophils Relative: 0 %
EOS PCT: 1 %
Eosinophils Absolute: 0.1 10*3/uL (ref 0.0–0.7)
HCT: 41 % (ref 36.0–46.0)
Hemoglobin: 13.8 g/dL (ref 12.0–15.0)
LYMPHS ABS: 1.5 10*3/uL (ref 0.7–4.0)
LYMPHS PCT: 22 %
MCH: 27.4 pg (ref 26.0–34.0)
MCHC: 33.7 g/dL (ref 30.0–36.0)
MCV: 81.5 fL (ref 78.0–100.0)
MONO ABS: 0.7 10*3/uL (ref 0.1–1.0)
Monocytes Relative: 10 %
Neutro Abs: 4.4 10*3/uL (ref 1.7–7.7)
Neutrophils Relative %: 67 %
PLATELETS: 218 10*3/uL (ref 150–400)
RBC: 5.03 MIL/uL (ref 3.87–5.11)
RDW: 14.3 % (ref 11.5–15.5)
WBC: 6.7 10*3/uL (ref 4.0–10.5)

## 2017-03-06 LAB — CG4 I-STAT (LACTIC ACID): LACTIC ACID, VENOUS: 1.26 mmol/L (ref 0.5–1.9)

## 2017-03-06 NOTE — ED Triage Notes (Signed)
Pt comes in with complaints of flu like symptoms over the past 5 days.  Pt reports fever at home with highest being 102.1.  Pt states she thinks she got it from her father's caregiver. Reports dry unproductive cough. Denies nasal congestion.

## 2017-03-07 ENCOUNTER — Emergency Department (HOSPITAL_COMMUNITY)
Admission: EM | Admit: 2017-03-07 | Discharge: 2017-03-07 | Disposition: A | Discharge status: Home / Self Care | Payer: Self-pay | Attending: Emergency Medicine | Admitting: Emergency Medicine

## 2017-03-07 DIAGNOSIS — B9789 Other viral agents as the cause of diseases classified elsewhere: Secondary | ICD-10-CM

## 2017-03-07 DIAGNOSIS — J069 Acute upper respiratory infection, unspecified: Secondary | ICD-10-CM

## 2017-03-07 LAB — URINALYSIS, ROUTINE W REFLEX MICROSCOPIC
Bilirubin Urine: NEGATIVE
Glucose, UA: NEGATIVE mg/dL
Hgb urine dipstick: NEGATIVE
Ketones, ur: NEGATIVE mg/dL
Leukocytes, UA: NEGATIVE
Nitrite: NEGATIVE
Protein, ur: NEGATIVE mg/dL
Specific Gravity, Urine: 1.02 (ref 1.005–1.030)
pH: 5 (ref 5.0–8.0)

## 2017-03-07 LAB — CG4 I-STAT (LACTIC ACID): LACTIC ACID, VENOUS: 1.63 mmol/L (ref 0.5–1.9)

## 2017-03-07 MED ORDER — MAGNESIUM SULFATE 2 GM/50ML IV SOLN
2.0000 g | INTRAVENOUS | Status: AC
  Administered 2017-03-07: 2 g via INTRAVENOUS
  Filled 2017-03-07: qty 50

## 2017-03-07 MED ORDER — IPRATROPIUM-ALBUTEROL 0.5-2.5 (3) MG/3ML IN SOLN
3.0000 mL | Freq: Once | RESPIRATORY_TRACT | Status: AC
  Administered 2017-03-07: 3 mL via RESPIRATORY_TRACT

## 2017-03-07 MED ORDER — LORATADINE 10 MG PO TABS: 10.0000 mg | ORAL_TABLET | Freq: Every day | ORAL | 0 refills | Status: DC

## 2017-03-07 MED ORDER — ALBUTEROL SULFATE HFA 108 (90 BASE) MCG/ACT IN AERS: 2.0000 | INHALATION_SPRAY | Freq: Once | RESPIRATORY_TRACT | Status: DC

## 2017-03-07 MED ORDER — IPRATROPIUM-ALBUTEROL 0.5-2.5 (3) MG/3ML IN SOLN
RESPIRATORY_TRACT | Status: AC
  Administered 2017-03-07: 3 mL via RESPIRATORY_TRACT
  Filled 2017-03-07: qty 3

## 2017-03-07 MED ORDER — BENZONATATE 100 MG PO CAPS: 100.0000 mg | ORAL_CAPSULE | Freq: Three times a day (TID) | ORAL | 0 refills | Status: DC | PRN

## 2017-03-07 MED ORDER — SODIUM CHLORIDE 0.9 % IV BOLUS (SEPSIS)
1000.0000 mL | Freq: Once | INTRAVENOUS | Status: AC
  Administered 2017-03-07: 1000 mL via INTRAVENOUS

## 2017-03-07 MED ORDER — ACETAMINOPHEN 325 MG PO TABS
650.0000 mg | ORAL_TABLET | Freq: Once | ORAL | Status: AC
  Administered 2017-03-07: 650 mg via ORAL
  Filled 2017-03-07: qty 2

## 2017-03-07 MED ORDER — AZITHROMYCIN 250 MG PO TABS: 250.0000 mg | ORAL_TABLET | Freq: Every day | ORAL | 0 refills | Status: DC

## 2017-03-07 MED ORDER — BENZONATATE 100 MG PO CAPS
200.0000 mg | ORAL_CAPSULE | Freq: Once | ORAL | Status: AC
  Administered 2017-03-07: 200 mg via ORAL
  Filled 2017-03-07: qty 2

## 2017-03-07 NOTE — ED Provider Notes (Signed)
[facility]-EMERGENCY DEPT Provider Note   CSN: [id number] Arrival date & time: 03/06/17  2235    History   Chief Complaint Chief Complaint  Patient presents with  . Flu Like Symptoms    HPI Erica Zhang is a 51 y.o. female.  51 year old female with a history of asthma, depression, anxiety, and mitral valve disorder presents to the emergency department for flulike symptoms. She notes that symptoms began 5 days ago. She has had chest congestion as well as a dry cough. Frequent coughing has caused episodes of stress incontinence. Patient notes a fever of 101F prior to arrival. She denies taking any antipyretics for fever. She has taken Robitussin for cough without relief. Patient decided to seek evaluation this evening as she felt short of breath last night. She has not had any vomiting or diarrhea. She states that her symptoms began after she was exposed to one of her father's caregivers.      Past Medical History:  Diagnosis Date  . Allergy   . Anxiety   . Asthma   . Depression   . Kidney stone   . Mitral valve disorder     Patient Active Problem List   Diagnosis Date Noted  . History of panic attacks 11/20/2014  . Palpitations 11/20/2014    Past Surgical History:  Procedure Laterality Date  . DNC    . KIDNEY STONE SURGERY  2004   stone removed from ureter  . TONSILLECTOMY  1990    OB History    No data available       Home Medications    Prior to Admission medications   Medication Sig Start Date End Date Taking? Authorizing Provider  amoxicillin-clavulanate (AUGMENTIN) 875-125 MG tablet Take 1 tablet by mouth 2 (two) times daily. 06/22/15   Carmelina Dane, MD  azithromycin (ZITHROMAX) 250 MG tablet Take 1 tablet (250 mg total) by mouth daily. Take first 2 tablets together, then 1 every day until finished. 03/07/17   Antony Madura, PA-C  benzonatate (TESSALON) 100 MG capsule Take 1-2 capsules (100-200 mg total) by mouth every 8 (eight) hours as needed for  cough. 03/07/17   Antony Madura, PA-C  chlorpheniramine-HYDROcodone (TUSSIONEX PENNKINETIC ER) 10-8 MG/5ML SUER Take 5 mLs by mouth 2 (two) times daily. 06/22/15   Carmelina Dane, MD  loratadine (CLARITIN) 10 MG tablet Take 1 tablet (10 mg total) by mouth daily. 03/07/17   Antony Madura, PA-C  sertraline (ZOLOFT) 100 MG tablet Take 1 tablet (100 mg total) by mouth at bedtime. PATIENT NEEDS OFFICE VISIT FOR ADDITIONAL REFILLS 06/11/13   Carmelina Dane, MD    Family History Family History  Problem Relation Age of Onset  . Adopted: Yes    Social History Social History  Substance Use Topics  . Smoking status: Never Smoker  . Smokeless tobacco: Never Used  . Alcohol use 0.6 oz/week    1 Standard drinks or equivalent per week     Allergies   Aspirin; Ibuprofen; and Naproxen sodium   Review of Systems Review of Systems Ten systems reviewed and are negative for acute change, except as noted in the HPI.    Physical Exam Updated Vital Signs BP (!) 126/94 (BP Location: Left Wrist)   Pulse 91   Temp 100 F (37.8 C) (Oral)   Resp 18   Ht 5\' 7"  (1.702 m)   Wt 111.6 kg (246 lb)   LMP 06/03/2015   SpO2 94%   BMI 38.53 kg/m   Physical Exam  Constitutional: She is oriented to person, place, and time. She appears well-developed and well-nourished. No distress.  Nontoxic appearing and in NAD. Pleasant. Obese.  HENT:  Head: Normocephalic and atraumatic.  Audible nasal congestion. Patient tolerating secretions without difficulty.  Eyes: Conjunctivae and EOM are normal. No scleral icterus.  Neck: Normal range of motion.  Cardiovascular: Normal rate, regular rhythm and intact distal pulses.   Patient not tachycardic as noted in triage.  Pulmonary/Chest: Effort normal. No respiratory distress. She has no rales.  Dry, hacking cough. Lungs grossly clear. No hypoxia. No rales.  Musculoskeletal: Normal range of motion.  Neurological: She is alert and oriented to person, place, and  time. She exhibits normal muscle tone. Coordination normal.  GCS 15. Patient moving all extremities.  Skin: Skin is warm and dry. No rash noted. She is not diaphoretic. No erythema. No pallor.  Psychiatric: She has a normal mood and affect. Her behavior is normal.  Nursing note and vitals reviewed.    ED Treatments / Results  Labs (all labs ordered are listed, but only abnormal results are displayed) Labs Reviewed  COMPREHENSIVE METABOLIC PANEL - Abnormal; Notable for the following:       Result Value   Glucose, Bld 119 (*)    All other components within normal limits  CBC WITH DIFFERENTIAL/PLATELET  URINALYSIS, ROUTINE W REFLEX MICROSCOPIC  I-STAT CG4 LACTIC ACID, ED  CG4 I-STAT (LACTIC ACID)  I-STAT CG4 LACTIC ACID, ED  CG4 I-STAT (LACTIC ACID)    EKG  EKG Interpretation None       Radiology Dg Chest 2 View  Result Date: 03/06/2017 CLINICAL DATA:  Initial evaluation for acute cough, shortness of breath, fever. EXAM: CHEST  2 VIEW COMPARISON:  Prior radiograph from 05/12/2016. FINDINGS: The cardiac and mediastinal silhouettes are stable in size and contour, and remain within normal limits. The lungs are normally inflated. No airspace consolidation, pleural effusion, or pulmonary edema is identified. There is no pneumothorax. No acute osseous abnormality identified. IMPRESSION: No radiographic evidence for active cardiopulmonary disease. Electronically Signed   By: Rise Mu M.D.   On: 03/06/2017 23:34    Procedures Procedures (including critical care time)  Medications Ordered in ED Medications  albuterol (PROVENTIL HFA;VENTOLIN HFA) 108 (90 Base) MCG/ACT inhaler 2 puff (not administered)  ipratropium-albuterol (DUONEB) 0.5-2.5 (3) MG/3ML nebulizer solution 3 mL (3 mLs Nebulization Given 03/07/17 0242)  sodium chloride 0.9 % bolus 1,000 mL (1,000 mLs Intravenous New Bag/Given 03/07/17 0320)  acetaminophen (TYLENOL) tablet 650 mg (650 mg Oral Given 03/07/17 0305)    benzonatate (TESSALON) capsule 200 mg (200 mg Oral Given 03/07/17 0305)  magnesium sulfate IVPB 2 g 50 mL (2 g Intravenous New Bag/Given 03/07/17 0321)     Initial Impression / Assessment and Plan / ED Course  I have reviewed the triage vital signs and the nursing notes.  Pertinent labs & imaging results that were available during my care of the patient were reviewed by me and considered in my medical decision making (see chart for details).     Pt CXR negative for acute infiltrate. Patient's symptoms are consistent with URI, likely viral etiology. Discussed that antibiotics are not indicated for viral infections. Patient requesting antibiotics despite this discussion as she cares for her elderly father. Patient will also be discharged with symptomatic treatment. She verbalizes understanding and is agreeable with plan. Patient is hemodynamically stable and in NAD prior to discharge   Final Clinical Impressions(s) / ED Diagnoses   Final diagnoses:  Viral  URI with cough    New Prescriptions New Prescriptions   LORATADINE (CLARITIN) 10 MG TABLET    Take 1 tablet (10 mg total) by mouth daily.     Antony MaduraHumes, Caden Fatica, PA-C 03/07/17 [id number]    Derwood KaplanNanavati, Ankit, MD 03/07/17 [phone]

## 2021-02-20 ENCOUNTER — Emergency Department (HOSPITAL_BASED_OUTPATIENT_CLINIC_OR_DEPARTMENT_OTHER)
Admission: EM | Admit: 2021-02-20 | Discharge: 2021-02-20 | Disposition: A | Discharge status: Home / Self Care | Payer: 59 | Attending: Emergency Medicine | Admitting: Emergency Medicine

## 2021-02-20 ENCOUNTER — Encounter (HOSPITAL_BASED_OUTPATIENT_CLINIC_OR_DEPARTMENT_OTHER): Payer: Self-pay | Admitting: *Deleted

## 2021-02-20 ENCOUNTER — Other Ambulatory Visit: Payer: Self-pay

## 2021-02-20 DIAGNOSIS — J45909 Unspecified asthma, uncomplicated: Secondary | ICD-10-CM | POA: Diagnosis not present

## 2021-02-20 DIAGNOSIS — W57XXXA Bitten or stung by nonvenomous insect and other nonvenomous arthropods, initial encounter: Secondary | ICD-10-CM | POA: Diagnosis not present

## 2021-02-20 DIAGNOSIS — S3991XA Unspecified injury of abdomen, initial encounter: Secondary | ICD-10-CM | POA: Diagnosis present

## 2021-02-20 DIAGNOSIS — S301XXA Contusion of abdominal wall, initial encounter: Secondary | ICD-10-CM | POA: Insufficient documentation

## 2021-02-20 MED ORDER — HYDROCORTISONE 1 % EX CREA: TOPICAL_CREAM | CUTANEOUS | 0 refills | Status: DC

## 2021-02-20 MED ORDER — CEPHALEXIN 500 MG PO CAPS: 500.0000 mg | ORAL_CAPSULE | Freq: Three times a day (TID) | ORAL | 0 refills | Status: AC

## 2021-02-20 NOTE — Discharge Instructions (Addendum)
Call your primary care doctor or specialist as discussed in the next 2-3 days.   Return immediately back to the ER if:  Your symptoms worsen within the next 12-24 hours. You develop new symptoms such as new fevers, persistent vomiting, new pain, shortness of breath, or new weakness or numbness, or if you have any other concerns.  

## 2021-02-20 NOTE — ED Triage Notes (Addendum)
Insect bite to abd, started on Bactrim on Friday via Virtual call, today small red areas outside of local area noted. Some areas of soreness under rt arm, was told on Friday probably lymph nodes irritated from infection.

## 2021-02-20 NOTE — ED Provider Notes (Signed)
[facility] EMERGENCY DEPT Provider Note   CSN: [id number] Arrival date & time: 02/20/21  1425     History Chief Complaint  Patient presents with   Insect Bite    Erica Zhang is a 55 y.o. female.  Patient presents to ER chief complaint of bug bite to lower abdomen.  She states that she noticed this about 4 days ago.  She states that it is itchy and she scratched it initially but has been trying to scratch it for the last couple of days.  She spoke to a doctor on a video conference and was prescribed Bactrim which she has been taking 2 days.  She is also been applying ice to the area.  She is noticed area of redness looks slightly increased and presents to the ER.  Otherwise denies any pain, denies fevers denies cough denies vomiting denies diarrhea.  She also said that she had some area of soreness in the right lateral side of her abdomen which she has been rubbing for comfort.      Past Medical History:  Diagnosis Date   Allergy    Anxiety    Asthma    Depression    Kidney stone    Mitral valve disorder     Patient Active Problem List   Diagnosis Date Noted   History of panic attacks 11/20/2014   Palpitations 11/20/2014    Past Surgical History:  Procedure Laterality Date   [facility]     KIDNEY STONE SURGERY  2004   stone removed from ureter   TONSILLECTOMY  1990     OB History   No obstetric history on file.     Family History  Adopted: Yes    Social History   Tobacco Use   Smoking status: Never   Smokeless tobacco: Never  Vaping Use   Vaping Use: Never used  Substance Use Topics   Alcohol use: Yes    Alcohol/week: 1.0 standard drink    Types: 1 Standard drinks or equivalent per week   Drug use: No    Home Medications Prior to Admission medications   Medication Sig Start Date End Date Taking? Authorizing Provider  cephALEXin (KEFLEX) 500 MG capsule Take 1 capsule (500 mg total) by mouth 3 (three) times daily for 5 days. 02/20/21  02/25/21 Yes Cheryll Cockayne, MD  hydrocortisone cream 1 % Apply to affected area 2 times daily for 1 week 02/20/21  Yes Ruble Pumphrey, Eustace Moore, MD  lisinopril (ZESTRIL) 5 MG tablet Take 5 mg by mouth daily.   Yes [provider]  sertraline (ZOLOFT) 100 MG tablet Take 1 tablet (100 mg total) by mouth at bedtime. PATIENT NEEDS OFFICE VISIT FOR ADDITIONAL REFILLS 06/11/13  Yes Carmelina Dane, MD  benzonatate (TESSALON) 100 MG capsule Take 1-2 capsules (100-200 mg total) by mouth every 8 (eight) hours as needed for cough. 03/07/17   Antony Madura, PA-C  chlorpheniramine-HYDROcodone (TUSSIONEX PENNKINETIC ER) 10-8 MG/5ML SUER Take 5 mLs by mouth 2 (two) times daily. 06/22/15   Carmelina Dane, MD  loratadine (CLARITIN) 10 MG tablet Take 1 tablet (10 mg total) by mouth daily. 03/07/17   Antony Madura, PA-C    Allergies    Aspirin, Ibuprofen, and Naproxen sodium  Review of Systems   Review of Systems  Constitutional:  Negative for fever.  HENT:  Negative for ear pain.   Eyes:  Negative for pain.  Respiratory:  Negative for cough.   Cardiovascular:  Negative for chest pain.  Gastrointestinal:  Negative for abdominal pain.  Genitourinary:  Negative for flank pain.  Musculoskeletal:  Negative for back pain.  Skin:  Positive for rash.  Neurological:  Negative for headaches.   Physical Exam Updated Vital Signs BP 139/84 (BP Location: Right Arm)   Pulse 71   Temp 98.2 F (36.8 C) (Oral)   Resp 16   Ht 5' 6.5" (1.689 m)   Wt 100.2 kg   LMP 06/03/2015   SpO2 98%   BMI 35.14 kg/m   Physical Exam Constitutional:      General: She is not in acute distress.    Appearance: Normal appearance.  HENT:     Head: Normocephalic.     Nose: Nose normal.  Eyes:     Extraocular Movements: Extraocular movements intact.  Cardiovascular:     Rate and Rhythm: Normal rate.  Pulmonary:     Effort: Pulmonary effort is normal.  Musculoskeletal:        General: Normal range of motion.     Cervical  back: Normal range of motion.  Skin:    Findings: Bruising and erythema present.     Comments: There is a patch of erythema approximately 3 x 4 cm in the mid abdomen region.  There is a central area that is slightly raised consistent with patient's report of insect bite.  No purulence or fluctuant fluid or induration or tenderness on palpation.  No abnormal warmth on palpation.  Region of the right side is located along the lateral 10th rib region underneath the right breast fold.  There is an area of induration and bruising there which the patient states is because she is been rubbing the area for the last few days for comfort.  No abnormal warmth no masses no tenderness on palpation noted.  Neurological:     General: No focal deficit present.     Mental Status: She is alert. Mental status is at baseline.    ED Results / Procedures / Treatments   Labs (all labs ordered are listed, but only abnormal results are displayed) Labs Reviewed - No data to display  EKG None  Radiology No results found.  Procedures Procedures   Medications Ordered in ED Medications - No data to display  ED Course  I have reviewed the triage vital signs and the nursing notes.  Pertinent labs & imaging results that were available during my care of the patient were reviewed by me and considered in my medical decision making (see chart for details).    MDM Rules/Calculators/A&P                          Abdominal region of erythema is most consistent with a bug bite and perhaps topical dermatitis.  Patient appears very concerned that it is cellulitis but she has no fevers no abnormal warmth no tenderness to the region no fluctuance or purulent discharge.  She is on Bactrim at this time.  I will switch her out to Keflex.  Advised topical hydrocortisone cream to the lesion.  Advised following up with her doctor again within the next 3 to 4 days.  Advising immediate return if the area becomes tender, has  increased warmth increased size or pain or if she has any additional concerns.  Final Clinical Impression(s) / ED Diagnoses Final diagnoses:  Bug bite, initial encounter    Rx / DC Orders ED Discharge Orders          Ordered  cephALEXin (KEFLEX) 500 MG capsule  3 times daily        02/20/21 1528    hydrocortisone cream 1 %        02/20/21 1528             Cheryll Cockayne, MD 02/20/21 1529

## 2021-07-24 ENCOUNTER — Other Ambulatory Visit: Payer: Self-pay | Admitting: Obstetrics and Gynecology

## 2021-07-25 ENCOUNTER — Other Ambulatory Visit: Payer: Self-pay | Admitting: Obstetrics and Gynecology

## 2021-07-25 DIAGNOSIS — R928 Other abnormal and inconclusive findings on diagnostic imaging of breast: Secondary | ICD-10-CM

## 2021-08-19 ENCOUNTER — Emergency Department (HOSPITAL_BASED_OUTPATIENT_CLINIC_OR_DEPARTMENT_OTHER)
Admission: EM | Admit: 2021-08-19 | Discharge: 2021-08-19 | Disposition: A | Discharge status: Home / Self Care | Payer: 59 | Attending: Emergency Medicine | Admitting: Emergency Medicine

## 2021-08-19 ENCOUNTER — Other Ambulatory Visit: Payer: Self-pay

## 2021-08-19 ENCOUNTER — Encounter (HOSPITAL_BASED_OUTPATIENT_CLINIC_OR_DEPARTMENT_OTHER): Payer: Self-pay | Admitting: *Deleted

## 2021-08-19 ENCOUNTER — Emergency Department (HOSPITAL_BASED_OUTPATIENT_CLINIC_OR_DEPARTMENT_OTHER): Payer: 59 | Admitting: Radiology

## 2021-08-19 DIAGNOSIS — R509 Fever, unspecified: Secondary | ICD-10-CM | POA: Insufficient documentation

## 2021-08-19 DIAGNOSIS — J45909 Unspecified asthma, uncomplicated: Secondary | ICD-10-CM | POA: Insufficient documentation

## 2021-08-19 DIAGNOSIS — Z20822 Contact with and (suspected) exposure to covid-19: Secondary | ICD-10-CM | POA: Insufficient documentation

## 2021-08-19 DIAGNOSIS — J111 Influenza due to unidentified influenza virus with other respiratory manifestations: Secondary | ICD-10-CM

## 2021-08-19 DIAGNOSIS — R3915 Urgency of urination: Secondary | ICD-10-CM | POA: Diagnosis not present

## 2021-08-19 DIAGNOSIS — M791 Myalgia, unspecified site: Secondary | ICD-10-CM | POA: Insufficient documentation

## 2021-08-19 LAB — URINALYSIS, ROUTINE W REFLEX MICROSCOPIC
Bilirubin Urine: NEGATIVE
Glucose, UA: NEGATIVE mg/dL
Hgb urine dipstick: NEGATIVE
Leukocytes,Ua: NEGATIVE
Nitrite: NEGATIVE
Specific Gravity, Urine: 1.024 (ref 1.005–1.030)
pH: 5 (ref 5.0–8.0)

## 2021-08-19 LAB — RESP PANEL BY RT-PCR (FLU A&B, COVID) ARPGX2
Influenza A by PCR: NEGATIVE
Influenza B by PCR: NEGATIVE
SARS Coronavirus 2 by RT PCR: NEGATIVE

## 2021-08-19 NOTE — Discharge Instructions (Signed)
Drink plenty of fluids.  Take acetaminophen and/or ibuprofen as needed for fever or aching.  Return if you are having any new or concerning symptoms.

## 2021-08-19 NOTE — ED Triage Notes (Signed)
Pt c/o generalized body aches with fever of 102; pt has not taken any medications

## 2021-08-19 NOTE — ED Provider Notes (Signed)
[facility] EMERGENCY DEPT Provider Note   CSN: [id number] Arrival date & time: 08/19/21  0006     History Chief Complaint  Patient presents with   Generalized Body Aches    Erica Zhang is a 55 y.o. female.  The history is provided by the patient.  She had onset this afternoon chills and then started running fever which went up to 102.  She has not had any sweats.  She denies rhinorrhea, sore throat, cough.  She does endorse some body aches.  She denies nausea, vomiting, diarrhea.  She does endorse some urinary urgency, but states that this is not unusual for her.  She does have history of urinary tract infections and is also expresses concern about possible pneumonia.  She has received vaccinations for influenza and COVID-19.  She denies any sick contacts.   Past Medical History:  Diagnosis Date   Allergy    Anxiety    Asthma    Depression    Kidney stone    Mitral valve disorder     Patient Active Problem List   Diagnosis Date Noted   History of panic attacks 11/20/2014   Palpitations 11/20/2014    Past Surgical History:  Procedure Laterality Date   [facility]     KIDNEY STONE SURGERY  2004   stone removed from ureter   TONSILLECTOMY  1990     OB History   No obstetric history on file.     Family History  Adopted: Yes    Social History   Tobacco Use   Smoking status: Never   Smokeless tobacco: Never  Vaping Use   Vaping Use: Never used  Substance Use Topics   Alcohol use: Yes    Alcohol/week: 1.0 standard drink    Types: 1 Standard drinks or equivalent per week   Drug use: No    Home Medications Prior to Admission medications   Medication Sig Start Date End Date Taking? Authorizing Provider  benzonatate (TESSALON) 100 MG capsule Take 1-2 capsules (100-200 mg total) by mouth every 8 (eight) hours as needed for cough. 03/07/17   Antony Madura, PA-C  chlorpheniramine-HYDROcodone (TUSSIONEX PENNKINETIC ER) 10-8 MG/5ML SUER Take 5 mLs by  mouth 2 (two) times daily. 06/22/15   Carmelina Dane, MD  hydrocortisone cream 1 % Apply to affected area 2 times daily for 1 week 02/20/21   Cheryll Cockayne, MD  lisinopril (ZESTRIL) 5 MG tablet Take 5 mg by mouth daily.    [provider]  loratadine (CLARITIN) 10 MG tablet Take 1 tablet (10 mg total) by mouth daily. 03/07/17   Antony Madura, PA-C  sertraline (ZOLOFT) 100 MG tablet Take 1 tablet (100 mg total) by mouth at bedtime. PATIENT NEEDS OFFICE VISIT FOR ADDITIONAL REFILLS 06/11/13   Carmelina Dane, MD    Allergies    Aspirin, Ibuprofen, Naproxen sodium, and Nsaids  Review of Systems   Review of Systems  All other systems reviewed and are negative.  Physical Exam Updated Vital Signs BP 123/64    Pulse 78    Temp (!) 100.5 F (38.1 C) (Oral)    Resp 18    LMP 06/03/2015    SpO2 96%   Physical Exam Vitals and nursing note reviewed.  55 year old female, resting comfortably and in no acute distress. Vital signs are significant for elevated temperature. Oxygen saturation is 96%, which is normal. Head is normocephalic and atraumatic. PERRLA, EOMI. Oropharynx is clear. Neck is nontender and supple without  adenopathy or JVD. Back is nontender and there is no CVA tenderness. Lungs are clear without rales, wheezes, or rhonchi. Chest is nontender. Heart has regular rate and rhythm without murmur. Abdomen is soft, flat, nontender. Extremities have no cyanosis or edema, full range of motion is present. Skin is warm and dry without rash. Neurologic: Mental status is normal, cranial nerves are intact, moves all extremities equally.  ED Results / Procedures / Treatments   Labs (all labs ordered are listed, but only abnormal results are displayed) Labs Reviewed  URINALYSIS, ROUTINE W REFLEX MICROSCOPIC - Abnormal; Notable for the following components:      Result Value   Ketones, ur TRACE (*)    Protein, ur TRACE (*)    All other components within normal limits  RESP  PANEL BY RT-PCR (FLU A&B, COVID) [id number]   Radiology DG Chest 2 View  Result Date: 08/19/2021 CLINICAL DATA:  55 year old female with fever and body ache. EXAM: CHEST - 2 VIEW COMPARISON:  Chest radiograph 03/06/2017 and earlier. FINDINGS: Lung volumes and mediastinal contours remain normal. Visualized tracheal air column is within normal limits. Lungs appear stable and clear. No pneumothorax or pleural effusion. No acute osseous abnormality identified. Negative visible bowel gas. IMPRESSION: Negative.  No cardiopulmonary abnormality. Electronically Signed   By: Odessa Fleming M.D.   On: 08/19/2021 04:28    Procedures Procedures   Medications Ordered in ED Medications - No data to display  ED Course  I have reviewed the triage vital signs and the nursing notes.  Pertinent labs & imaging results that were available during my care of the patient were reviewed by me and considered in my medical decision making (see chart for details).    MDM Rules/Calculators/A&P                         Influenza-like illness.  Will check chest x-ray to rule out pneumonia, urinalysis to rule out UTI.  Old records are reviewed, and she does have prior ED visits for respiratory tract infections.  Urinalysis is negative for infection.  Chest x-ray shows no evidence of pneumonia.  Respiratory pathogen panel is negative for influenza and COVID-19.  Patient is advised of these findings, advised to continue symptomatic treatment.  Return precautions discussed.  Final Clinical Impression(s) / ED Diagnoses Final diagnoses:  Influenza-like illness    Rx / DC Orders ED Discharge Orders     None        Dione Booze, MD 08/19/21 [phone]

## 2022-07-19 ENCOUNTER — Encounter (HOSPITAL_BASED_OUTPATIENT_CLINIC_OR_DEPARTMENT_OTHER): Payer: Self-pay

## 2022-07-19 ENCOUNTER — Emergency Department (HOSPITAL_BASED_OUTPATIENT_CLINIC_OR_DEPARTMENT_OTHER)
Admission: EM | Admit: 2022-07-19 | Discharge: 2022-07-19 | Disposition: A | Discharge status: Home / Self Care | Payer: Commercial Managed Care - HMO | Attending: Emergency Medicine | Admitting: Emergency Medicine

## 2022-07-19 ENCOUNTER — Other Ambulatory Visit: Payer: Self-pay

## 2022-07-19 DIAGNOSIS — I10 Essential (primary) hypertension: Secondary | ICD-10-CM | POA: Insufficient documentation

## 2022-07-19 DIAGNOSIS — Z79899 Other long term (current) drug therapy: Secondary | ICD-10-CM | POA: Insufficient documentation

## 2022-07-19 DIAGNOSIS — F064 Anxiety disorder due to known physiological condition: Secondary | ICD-10-CM | POA: Insufficient documentation

## 2022-07-19 DIAGNOSIS — R519 Headache, unspecified: Secondary | ICD-10-CM | POA: Diagnosis present

## 2022-07-19 DIAGNOSIS — F418 Other specified anxiety disorders: Secondary | ICD-10-CM

## 2022-07-19 LAB — BASIC METABOLIC PANEL
Anion gap: 9 (ref 5–15)
BUN: 12 mg/dL (ref 6–20)
CO2: 24 mmol/L (ref 22–32)
Calcium: 9.6 mg/dL (ref 8.9–10.3)
Chloride: 107 mmol/L (ref 98–111)
Creatinine, Ser: 0.81 mg/dL (ref 0.44–1.00)
GFR, Estimated: >60 mL/min (ref >60)
Glucose, Bld: 95 mg/dL (ref 70–99)
Potassium: 3.9 mmol/L (ref 3.5–5.1)
Sodium: 140 mmol/L (ref 135–145)

## 2022-07-19 LAB — CBC WITH DIFFERENTIAL/PLATELET
Abs Immature Granulocytes: 0.02 10*3/uL (ref 0.00–0.07)
Basophils Absolute: 0 10*3/uL (ref 0.0–0.1)
Basophils Relative: 0 %
Eosinophils Absolute: 0.1 10*3/uL (ref 0.0–0.5)
Eosinophils Relative: 1 %
HCT: 38.5 % (ref 36.0–46.0)
Hemoglobin: 12.8 g/dL (ref 12.0–15.0)
Immature Granulocytes: 0 %
Lymphocytes Relative: 22 %
Lymphs Abs: 1.6 10*3/uL (ref 0.7–4.0)
MCH: 28.3 pg (ref 26.0–34.0)
MCHC: 33.2 g/dL (ref 30.0–36.0)
MCV: 85.2 fL (ref 80.0–100.0)
Monocytes Absolute: 0.4 10*3/uL (ref 0.1–1.0)
Monocytes Relative: 5 %
Neutro Abs: 5 10*3/uL (ref 1.7–7.7)
Neutrophils Relative %: 72 %
Platelets: 218 10*3/uL (ref 150–400)
RBC: 4.52 MIL/uL (ref 3.87–5.11)
RDW: 13.2 % (ref 11.5–15.5)
WBC: 7.1 10*3/uL (ref 4.0–10.5)
nRBC: 0 % (ref 0.0–0.2)

## 2022-07-19 MED ORDER — ACETAMINOPHEN 500 MG PO TABS: 1000.0000 mg | ORAL_TABLET | Freq: Once | ORAL | Status: DC

## 2022-07-19 NOTE — ED Notes (Signed)
Pt states she just took another half of Lisinopril from her personal supply that she brought with her.

## 2022-07-19 NOTE — ED Notes (Signed)
ED Provider at bedside. 

## 2022-07-19 NOTE — Discharge Instructions (Addendum)
Thank you for coming to [facility] Emergency Department. You were seen for hypertension. We did an exam, labs, and imaging, and these showed no acute findings.  Please follow up with your primary care provider within 1 week. You may need to have your medications adjusted.  Please take your blood pressure once in the afternoon after sitting calmly for 10-15 minutes. Please keep track of these recordings for your primary care physician. Please continue to take your medications as prescribed. Please continue to manage your other medications for anxiety and see your therapist as well to discuss your symptoms of anxiety.   Do not hesitate to return to the ED or call 911 if you experience: -Worsening symptoms -Blood pressure consistently >180/>110 with symptoms such as severe headache, chest pain, shortness of breath, or inability to urinate/blood in the urine -Lightheadedness, passing out -Fevers/chills -Anything else that concerns you

## 2022-07-19 NOTE — ED Provider Notes (Signed)
[name]   CSN: [id number] Arrival date & time: 07/19/22  1708     History  Chief Complaint  Patient presents with   Hypertension    Erica Zhang is a 56 y.o. female with palpitations, panic attacks presents with hypertension.   Patient presents with SBP >170 mmHg at home. She felt very anxious about this blood pressure and states she hasn't really had high blood pressures like this before so came in to the ED. She reports several recent changes to her medications including starting and discontinuing buspar, discontinuing wellbutrin, restarting valsartan and then increasing the dose from 40 mg to 80 mg after seeing her PCP on Monday. Patient also takes sertraline and lisinopril. When her blood pressure was too high at home today she took a lisinopril 5mg  before coming here. She had a slight headache this AM but currently does not have any chest pain, headache, SOB, urinary symptoms, lower extremity edema, numbness/tingling, asymmetric weakness, visual changes, difficulty speaking, confusion. She endorses significant anxiety about her blood pressure and thought she may die today. Was very worried about death, a blood clot, or a stroke. She has anxiety managed with therapy and medications but hasn't felt this anxious about her blood pressure before. She does keep very close eye on her sodium intake and sometimes feels too concerned about it, according to her friend at bedside.   Hypertension       Home Medications Prior to Admission medications   Medication Sig Start Date End Date Taking? Authorizing Provider  valsartan (DIOVAN) 40 MG tablet Take 40 mg by mouth daily.   Yes [provider]  hydrocortisone cream 1 % Apply to affected area 2 times daily for 1 week 02/20/21   Luna Fuse, MD  lisinopril (ZESTRIL) 5 MG tablet Take 5 mg by mouth daily.    [provider]  loratadine (CLARITIN) 10 MG tablet Take 1 tablet  (10 mg total) by mouth daily. 03/07/17   Antonietta Breach, PA-C  sertraline (ZOLOFT) 100 MG tablet Take 1 tablet (100 mg total) by mouth at bedtime. PATIENT NEEDS OFFICE VISIT FOR ADDITIONAL REFILLS 06/11/13   Roselee Culver, MD      Allergies    Aspirin, Ibuprofen, Naproxen sodium, and Nsaids    Review of Systems   Review of Systems Review of systems negative for f/c, recent illnesses.  A 10 point review of systems was performed and is negative unless otherwise reported in HPI.  Physical Exam Updated Vital Signs BP (!) 146/79 (BP Location: Left Arm)   Pulse (!) 56   Temp 98.1 F (36.7 C) (Tympanic)   Resp 12   Ht 5\' 7"  (1.702 m)   Wt 98.9 kg   LMP 06/03/2015   SpO2 100%   BMI 34.14 kg/m  Physical Exam General: Normal appearing female, lying in bed.  HEENT: PERRLA, Sclera anicteric, MMM, trachea midline. Cardiology: RRR, no murmurs/rubs/gallops. BL radial and DP pulses equal bilaterally.  Resp: Normal respiratory rate and effort. CTAB, no wheezes, rhonchi, crackles.  Abd: Soft, non-tender, non-distended. No rebound tenderness or guarding.  GU: Deferred. MSK: No peripheral edema or signs of trauma. Extremities without deformity or TTP. No cyanosis or clubbing. Skin: warm, dry. No rashes or lesions. Neuro: A&Ox4, CNs II-XII grossly intact. MAEs. Sensation grossly intact. Normal speech, normal gait.  Psych: Normal mood and affect.   ED Results / Procedures / Treatments   Labs (all labs ordered are listed, but only abnormal results  are displayed) Labs Reviewed  CBC WITH DIFFERENTIAL/PLATELET  BASIC METABOLIC PANEL    EKG EKG Interpretation  Date/Time:  Thursday July 19 2022 17:43:04 EST Ventricular Rate:  67 PR Interval:  136 QRS Duration: 103 QT Interval:  423 QTC Calculation: 447 R Axis:   35 Text Interpretation: Sinus rhythm Confirmed by Vivi Barrack [phone]) on 07/19/2022 6:58:43 PM  Procedures Procedures    Medications Ordered in ED Medications   acetaminophen (TYLENOL) tablet 1,000 mg (1,000 mg Oral Not Given 07/19/22 1819)    ED Course/ Medical Decision Making/ A&P                          Medical Decision Making Amount and/or Complexity of Data Reviewed Labs: ordered. Decision-making details documented in ED Course.  Risk OTC drugs.   Patient is extremely well-appearing, asymptomatic at this time. Blood pressure is 140s-150s mmHg systolic.  Patient does not have any symptoms on history nor signs on physical exam concerning for end organ damage secondary to hypertension.   Specifically, based up the patient's presentation, the patient is at sufficiently low risk for: -ACS given no CP, no SOB, normal cardio-pulmonary exam -SAH/stroke given no hx of acute headache/stroke like sxs and normal neurologic exam.  -end organ renal disease given normal Cr  Patient requests basic labs to be drawn. Labs demonstrate BMP/CBC wnl. EKG sinus rhythm with no e/o ischemia. Patient's blood pressure decreases to 140s/70s without any intervention here in the ED.  The patient was counseled regarding the deleterious effects of hypertension and the necessity of follow up with the patient's primary physician to establish a care plan. She is also encouraged to take her blood pressure once per day in the afternoon. She is encouraged to discuss her anxiety about her health with her therapist, as worry about her health should not prevent her from living her life. Instructed to f/u with PCP within 1 week. Will be discharged with discharge instructions/return precautions.  Dispo: DC in stable condition    I have personally reviewed and interpreted all labs.  Patient was maintained on a cardiac monitor.  I have personally interpreted the telemetry as NSR.  Clinical Course as of 07/19/22 1902  Thu Jul 19, 2022  1842 CBC with Differential wnl [HN]  1858 Basic metabolic panel wnl [HN]    Clinical Course User Index [HN] Loetta Rough, MD           Final Clinical Impression(s) / ED Diagnoses Final diagnoses:  Asymptomatic hypertension  Anxiety about health    Rx / DC Orders ED Discharge Orders     None        This note was created using dictation software, which may contain spelling or grammatical errors.    Loetta Rough, MD 07/19/22 1902

## 2022-07-19 NOTE — ED Triage Notes (Signed)
Pt presents with c/o high blood pressure. Pt reports visiting PCP on Monday with high blood pressure. Valsartan increased to 80 mg from 40 mg. Pt reports headache this morning with slight chest pain.   Pt anxious at triage

## 2022-12-24 ENCOUNTER — Other Ambulatory Visit: Payer: Self-pay | Admitting: Obstetrics and Gynecology

## 2022-12-24 DIAGNOSIS — R928 Other abnormal and inconclusive findings on diagnostic imaging of breast: Secondary | ICD-10-CM

## 2022-12-25 ENCOUNTER — Other Ambulatory Visit: Payer: Self-pay | Admitting: Obstetrics and Gynecology

## 2022-12-25 DIAGNOSIS — R928 Other abnormal and inconclusive findings on diagnostic imaging of breast: Secondary | ICD-10-CM

## 2023-01-14 ENCOUNTER — Other Ambulatory Visit: Payer: Commercial Managed Care - HMO

## 2023-01-24 ENCOUNTER — Ambulatory Visit
Admission: RE | Admit: 2023-01-24 | Discharge: 2023-01-24 | Disposition: A | Discharge status: Home / Self Care | Payer: Commercial Managed Care - HMO | Source: Ambulatory Visit | Attending: Obstetrics and Gynecology | Admitting: Obstetrics and Gynecology

## 2023-01-24 DIAGNOSIS — R928 Other abnormal and inconclusive findings on diagnostic imaging of breast: Secondary | ICD-10-CM

## 2023-03-27 ENCOUNTER — Emergency Department (HOSPITAL_BASED_OUTPATIENT_CLINIC_OR_DEPARTMENT_OTHER): Payer: Commercial Managed Care - HMO

## 2023-03-27 ENCOUNTER — Encounter (HOSPITAL_BASED_OUTPATIENT_CLINIC_OR_DEPARTMENT_OTHER): Payer: Self-pay | Admitting: Emergency Medicine

## 2023-03-27 ENCOUNTER — Emergency Department (HOSPITAL_BASED_OUTPATIENT_CLINIC_OR_DEPARTMENT_OTHER)
Admission: EM | Admit: 2023-03-27 | Discharge: 2023-03-27 | Disposition: A | Discharge status: Home / Self Care | Payer: Commercial Managed Care - HMO | Attending: Emergency Medicine | Admitting: Emergency Medicine

## 2023-03-27 ENCOUNTER — Other Ambulatory Visit: Payer: Self-pay

## 2023-03-27 DIAGNOSIS — R519 Headache, unspecified: Secondary | ICD-10-CM | POA: Diagnosis present

## 2023-03-27 DIAGNOSIS — J45909 Unspecified asthma, uncomplicated: Secondary | ICD-10-CM | POA: Insufficient documentation

## 2023-03-27 DIAGNOSIS — H6501 Acute serous otitis media, right ear: Secondary | ICD-10-CM | POA: Diagnosis not present

## 2023-03-27 LAB — BASIC METABOLIC PANEL
Anion gap: 7 (ref 5–15)
BUN: 14 mg/dL (ref 6–20)
CO2: 26 mmol/L (ref 22–32)
Calcium: 9.4 mg/dL (ref 8.9–10.3)
Chloride: 106 mmol/L (ref 98–111)
Creatinine, Ser: 0.77 mg/dL (ref 0.44–1.00)
GFR, Estimated: >60 mL/min (ref >60)
Glucose, Bld: 98 mg/dL (ref 70–99)
Potassium: 4.2 mmol/L (ref 3.5–5.1)
Sodium: 139 mmol/L (ref 135–145)

## 2023-03-27 LAB — CBC
HCT: 39.5 % (ref 36.0–46.0)
Hemoglobin: 13.1 g/dL (ref 12.0–15.0)
MCH: 28.4 pg (ref 26.0–34.0)
MCHC: 33.2 g/dL (ref 30.0–36.0)
MCV: 85.7 fL (ref 80.0–100.0)
Platelets: 193 10*3/uL (ref 150–400)
RBC: 4.61 MIL/uL (ref 3.87–5.11)
RDW: 13.4 % (ref 11.5–15.5)
WBC: 7.6 10*3/uL (ref 4.0–10.5)
nRBC: 0 % (ref 0.0–0.2)

## 2023-03-27 MED ORDER — CETIRIZINE-PSEUDOEPHEDRINE ER 5-120 MG PO TB12: 1.0000 | ORAL_TABLET | Freq: Every day | ORAL | 0 refills | Status: DC | PRN

## 2023-03-27 NOTE — Discharge Instructions (Signed)
As discussed, workup today overall reassuring.  Given clear fluid level behind your right eardrum, would recommend beginning of allergy medicine as prescribed.  Regarding concern for right-sided TMJ, will recommend continuation of anti-inflammatories in the form of Aleve/Motrin.  Will recommend close follow-up with primary care in the outpatient setting for reevaluation of symptoms.  Please do not hesitate to return to the emergency department if the worrisome signs and symptoms we discussed become apparent.

## 2023-03-27 NOTE — ED Notes (Signed)
Discharge instructions, follow up care, and prescription reviewed and explained, pt verbalized understanding and had no further questions on d/c. Pt caox4, ambulatory, NAD on d/c.  

## 2023-03-27 NOTE — ED Triage Notes (Signed)
Pt via pov from home with head fullness today. She reports a sensation of "something pouring over her brain" when she awakened today. She also reports having some ear/mouth issues lately, wonders if these could be related. Pt alert & oriented, nad noted.

## 2023-03-27 NOTE — ED Provider Notes (Signed)
[place] EMERGENCY DEPARTMENT AT [facility] Provider Note   CSN: [id number] Arrival date & time: 03/27/23  1421     History  Chief Complaint  Patient presents with   Headache    Erica Zhang is a 57 y.o. female.   Headache   57 year old female presents emergency department with complaints of right-sided head fullness.  Patient states that she had an episode earlier today when she is laying of her body on her bed.  States that she sat up abruptly and reported sensation of "something pouring over the right side of my brain" accompanied with feelings of lightheadedness when she sat up.  States that symptoms lasted for a matter of seconds before spontaneous resolution.  Patient states that she has been having some increased pressure in her right ear over the past few days and is concerned about "infection spreading this morning" during episode.  Denies any fever, chills, night sweats, chest pain, shortness of breath.  Denies any visual disturbance, gait abnormality, slurred speech, facial droop, weakness/sensory deficits in upper or lower extremities.  Patient requesting imaging of her head.  Patient states she is currently without symptoms.  Past medical history significant for asthma, depression, anxiety, panic attack  Home Medications Prior to Admission medications   Medication Sig Start Date End Date Taking? Authorizing Provider  cetirizine-pseudoephedrine (ZYRTEC-D) 5-120 MG tablet Take 1 tablet by mouth daily as needed for allergies. 03/27/23  Yes Sherian Maroon A, PA  hydrocortisone cream 1 % Apply to affected area 2 times daily for 1 week 02/20/21   Cheryll Cockayne, MD  lisinopril (ZESTRIL) 5 MG tablet Take 5 mg by mouth daily.    [provider]  loratadine (CLARITIN) 10 MG tablet Take 1 tablet (10 mg total) by mouth daily. 03/07/17   Antony Madura, PA-C  sertraline (ZOLOFT) 100 MG tablet Take 1 tablet (100 mg total) by mouth at bedtime. PATIENT NEEDS OFFICE  VISIT FOR ADDITIONAL REFILLS 06/11/13   Carmelina Dane, MD  valsartan (DIOVAN) 40 MG tablet Take 40 mg by mouth daily.    [provider]      Allergies    Aspirin, Ibuprofen, Naproxen sodium, and Nsaids    Review of Systems   Review of Systems  Neurological:  Positive for headaches.  All other systems reviewed and are negative.   Physical Exam Updated Vital Signs BP 126/83   Pulse (!) 59   Temp 98 F (36.7 C)   Resp 15   Ht 5\' 7"  (1.702 m)   Wt 98.9 kg   LMP 06/03/2015   SpO2 98%   BMI 34.15 kg/m  Physical Exam Vitals and nursing note reviewed.  Constitutional:      General: She is not in acute distress.    Appearance: She is well-developed.  HENT:     Head: Normocephalic and atraumatic.     Comments: Tenderness to palpation right-sided TMJ.  Pain with range of motion of right-sided mandible and TMJ.    Right Ear: Ear canal and external ear normal.     Left Ear: Tympanic membrane, ear canal and external ear normal.     Ears:     Comments: Patient with nonerythematous right-sided TM but with serous fluid level.    Nose: Nose normal.     Mouth/Throat:     Mouth: Mucous membranes are moist.     Pharynx: Oropharynx is clear.  Eyes:     Conjunctiva/sclera: Conjunctivae normal.  Cardiovascular:     Rate  and Rhythm: Normal rate and regular rhythm.     Heart sounds: No murmur heard. Pulmonary:     Effort: Pulmonary effort is normal. No respiratory distress.     Breath sounds: Normal breath sounds.  Abdominal:     Palpations: Abdomen is soft.     Tenderness: There is no abdominal tenderness.  Musculoskeletal:        General: No swelling.     Cervical back: Neck supple.  Skin:    General: Skin is warm and dry.     Capillary Refill: Capillary refill takes less than 2 seconds.  Neurological:     Mental Status: She is alert.     Comments: Alert and oriented to self, place, time and event.   Speech is fluent, clear without dysarthria or dysphasia.    Strength 5/5 in upper/lower extremities   Sensation intact in upper/lower extremities   Normal gait.  CN I not tested  CN II not tested CN III, IV, VI PERRLA and EOMs intact bilaterally  CN V Intact sensation to sharp and light touch to the face  CN VII facial movements symmetric  CN VIII not tested  CN IX, X no uvula deviation, symmetric rise of soft palate  CN XI 5/5 SCM and trapezius strength bilaterally  CN XII Midline tongue protrusion, symmetric L/R movements   Psychiatric:        Mood and Affect: Mood normal.     ED Results / Procedures / Treatments   Labs (all labs ordered are listed, but only abnormal results are displayed) Labs Reviewed  BASIC METABOLIC PANEL  CBC    EKG None  Radiology CT Head Wo Contrast  Result Date: 03/27/2023 CLINICAL DATA:  Headache, increasing frequency or severity EXAM: CT HEAD WITHOUT CONTRAST TECHNIQUE: Contiguous axial images were obtained from the base of the skull through the vertex without intravenous contrast. RADIATION DOSE REDUCTION: This exam was performed according to the departmental dose-optimization program which includes automated exposure control, adjustment of the mA and/or kV according to patient size and/or use of iterative reconstruction technique. COMPARISON:  CT Head 01/22/07 FINDINGS: Brain: No evidence of acute infarction, hemorrhage, hydrocephalus, extra-axial collection or mass lesion/mass effect. Vascular: No hyperdense vessel or unexpected calcification. Skull: Normal. Negative for fracture or focal lesion. Sinuses/Orbits: No middle ear or mastoid effusion. Paranasal sinuses are clear. Other: None. IMPRESSION: No acute intracranial abnormality. Electronically Signed   By: Lorenza Cambridge M.D.   On: 03/27/2023 15:57    Procedures Procedures    Medications Ordered in ED Medications - No data to display  ED Course/ Medical Decision Making/ A&P                                 Medical Decision Making Amount  and/or Complexity of Data Reviewed Labs: ordered. Radiology: ordered.  Risk OTC drugs.   This patient presents to the ED for concern of headache, this involves an extensive number of treatment options, and is a complaint that carries with it a high risk of complications and morbidity.  The differential diagnosis includes CVA, cerebral venous thrombosis, migraine/tension/cluster headache, malignancy, otitis media, otitis externa, cellulitis, erysipelas, TMJ, orthostatic hypotension   Co morbidities that complicate the patient evaluation  See HPI   Additional history obtained:  Additional history obtained from EMR External records from outside source obtained and reviewed including hospital records   Lab Tests:  I Ordered, and personally interpreted labs.  The pertinent results include: No leukocytosis.  No evidence of anemia.  Placed within range.  No electrolyte abnormalities.  No renal dysfunction.   Imaging Studies ordered:  I ordered imaging studies including CT head I independently visualized and interpreted imaging which showed no acute intracranial abnormality I agree with the radiologist interpretation   Cardiac Monitoring: / EKG:  The patient was maintained on a cardiac monitor.  I personally viewed and interpreted the cardiac monitored which showed an underlying rhythm of: Sinus rhythm   Consultations Obtained:  N/a   Problem List / ED Course / Critical interventions / Medication management  Headache Reevaluation of the patient showed that the patient stayed the same I have reviewed the patients home medicines and have made adjustments as needed   Social Determinants of Health:  Denies tobacco, illicit drug use   Test / Admission - Considered:  Headache Vitals signs within normal range and stable throughout visit. Laboratory/imaging studies significant for: See above 57 year old female presents emergency department with complaints of right-sided  head fullness as well as pressure in her right ear.  Regarding right-sided headache, patient without symptoms upon initial presentation in the ED.  Patient symptoms seems to be transient related to positional changes when she sat up from a laying down position earlier today associated with feelings of lightheadedness.  Symptoms resolved seconds after beginning.  Initially on exam, patient without any neurologic deficit, temporal tenderness to palpation.  CT imaging was pursued due to patient's insistence of which was negative for any acute intracranial abnormality.  Low suspicion for CVA, vertebral artery/carotid artery dissection, history of venous thrombosis.  Patient without fever, meningismus, low suspicion for meningitis/encephalitis.  I suspect patient's symptoms are likely secondary to orthostatic hypotension given only experiencing said symptoms with positional changes from laying down to sitting up.  Regarding right-sided ear fullness, patient with evidence of serous fluid level behind right-sided TM without evidence of otitis media/otitis externa/perforated TM.  Patient was also with some right-sided TMJ tenderness on the left side with some pain with range of motion of the mandible.  Right-sided ear fullness could be attributable to both pathologies.  Will recommend symptomatic therapy as depicted in AVS and follow-up with primary care in the outpatient setting.  Treatment plan discussed at length with patient and she acknowledged understanding was agreeable to said plan.  Patient overall well-appearing, afebrile in no acute distress. Worrisome signs and symptoms were discussed with the patient, and the patient acknowledged understanding to return to the ED if noticed. Patient was stable upon discharge.          Final Clinical Impression(s) / ED Diagnoses Final diagnoses:  Acute nonintractable headache, unspecified headache type    Rx / DC Orders ED Discharge Orders          Ordered     cetirizine-pseudoephedrine (ZYRTEC-D) 5-120 MG tablet  Daily PRN        03/27/23 1707              Peter Garter, [place] 03/27/23 1844    Rondel Baton, MD 03/28/23 [phone]

## 2023-03-28 ENCOUNTER — Encounter: Payer: Self-pay | Admitting: Plastic Surgery

## 2024-06-30 ENCOUNTER — Other Ambulatory Visit: Payer: Self-pay

## 2024-07-01 ENCOUNTER — Emergency Department (HOSPITAL_BASED_OUTPATIENT_CLINIC_OR_DEPARTMENT_OTHER)
Admission: EM | Admit: 2024-07-01 | Discharge: 2024-07-01 | Disposition: A | Discharge status: Home / Self Care | Attending: Emergency Medicine | Admitting: Emergency Medicine

## 2024-07-01 ENCOUNTER — Other Ambulatory Visit: Payer: Self-pay

## 2024-07-01 ENCOUNTER — Emergency Department (HOSPITAL_BASED_OUTPATIENT_CLINIC_OR_DEPARTMENT_OTHER)

## 2024-07-01 ENCOUNTER — Encounter (HOSPITAL_BASED_OUTPATIENT_CLINIC_OR_DEPARTMENT_OTHER): Payer: Self-pay | Admitting: Emergency Medicine

## 2024-07-01 DIAGNOSIS — S3992XA Unspecified injury of lower back, initial encounter: Secondary | ICD-10-CM | POA: Diagnosis present

## 2024-07-01 DIAGNOSIS — Y92000 Kitchen of unspecified non-institutional (private) residence as  the place of occurrence of the external cause: Secondary | ICD-10-CM | POA: Diagnosis not present

## 2024-07-01 DIAGNOSIS — Z79899 Other long term (current) drug therapy: Secondary | ICD-10-CM | POA: Insufficient documentation

## 2024-07-01 DIAGNOSIS — I10 Essential (primary) hypertension: Secondary | ICD-10-CM | POA: Diagnosis not present

## 2024-07-01 DIAGNOSIS — W010XXA Fall on same level from slipping, tripping and stumbling without subsequent striking against object, initial encounter: Secondary | ICD-10-CM | POA: Insufficient documentation

## 2024-07-01 DIAGNOSIS — S32018A Other fracture of first lumbar vertebra, initial encounter for closed fracture: Secondary | ICD-10-CM | POA: Insufficient documentation

## 2024-07-01 DIAGNOSIS — S32010A Wedge compression fracture of first lumbar vertebra, initial encounter for closed fracture: Secondary | ICD-10-CM

## 2024-07-01 MED ORDER — LIDOCAINE 5 % EX PTCH
1.0000 | MEDICATED_PATCH | CUTANEOUS | Status: DC
  Administered 2024-07-01: 1 via TRANSDERMAL
  Filled 2024-07-01: qty 1

## 2024-07-01 MED ORDER — METHOCARBAMOL 500 MG PO TABS: 500.0000 mg | ORAL_TABLET | Freq: Two times a day (BID) | ORAL | 0 refills | Status: DC

## 2024-07-01 MED ORDER — LIDOCAINE 5 % EX PTCH: 1.0000 | MEDICATED_PATCH | CUTANEOUS | 0 refills | Status: DC

## 2024-07-01 NOTE — Discharge Instructions (Signed)
 Please use the brace and try the lidocaine patches as needed.  Take Tylenol  for pain.  You may also take the muscle relaxer.  Do not drive drink alcohol while taking this as it may make you drowsy.  Please call and schedule a follow-up appoint with a neurosurgeon at the number provided.  Return to the ER for worsening symptoms.

## 2024-07-01 NOTE — ED Triage Notes (Signed)
 Pt caox4 ambulatory c/o lower back pain since mechanical fall while walking at home falling on her backside approx 2 nights ago. Denies weakness/numbness in extremities.

## 2024-07-01 NOTE — ED Notes (Signed)
 Called [facility] to order [employer]

## 2024-07-01 NOTE — Progress Notes (Signed)
 Orthopedic Tech Progress Note Patient Details:  Erica Zhang 02/13/1966 [id number]  Called in stat order to [facility] for a TLSO BACK BRACE   Patient ID: Erica Zhang, female   DOB: February 02, 1966, 58 y.o.   MRN: [id number]  Erica Zhang 07/01/2024, 6:15 PM

## 2024-07-01 NOTE — ED Notes (Signed)
 Reviewed discharge instructions, medications, and home care with pt. Pt verbalized understanding and had no further questions. Pt exited ED without complications.

## 2024-07-01 NOTE — ED Provider Notes (Signed)
 I received the in signout.  Patient presented today after a fall.  CT imaging was pending at the time of signout.  Patient does have an L1 compression fracture here.  Will call and discuss her case with neurosurgery.  The patient does have a reassuring neuroexam with equal strength and sensation throughout the bilateral lower extremities and denies any red flag symptoms for cord compressing lesion. Physical Exam  BP (!) 145/94 (BP Location: Right Arm)   Pulse 76   Temp 98.4 F (36.9 C) (Oral)   Resp 17   Ht 5' 7 (1.702 m)   Wt 96.6 kg   LMP 06/03/2015   SpO2 98%   BMI 33.36 kg/m   Physical Exam  Procedures  Procedures  ED Course / MDM   Clinical Course as of 07/01/24 1632  Wed Jul 01, 2024  1516 Patient signed out to Dr. Ula pending CT imaging. [VK]    Clinical Course User Index [VK] Kingsley, Victoria K, DO   Medical Decision Making Amount and/or Complexity of Data Reviewed Radiology: ordered.  Risk Prescription drug management.   The patient is fitted in the TLSO brace and is discharged with return precautions.       Ula Prentice SAUNDERS, MD 07/01/24 [phone]

## 2024-07-01 NOTE — ED Provider Notes (Signed)
 [place] EMERGENCY DEPARTMENT AT [facility] Provider Note   CSN: [id number] Arrival date & time: 07/01/24  1344     Patient presents with: Back Pain   Erica Zhang is a 58 y.o. female.   Patient is a 58 year old female with past medical history of hypertension presenting to the emergency department with back pain.  Patient states that 2 nights ago she slipped in her kitchen and fell and landed on her low back.  She denies hitting her head or any loss of consciousness.  She states EMS was initially called and helped her up but she initially declined transport at that time and has been just trying to ice her back and use lidocaine patches without significant improvement.  She states that she has significant pain with movement and ambulation.  She states that she has allergies and bad reactions while on pain medications and has not been wanting to take any medication at home.  She states that she has been constipated but has been urinating normally and fully emptying her bladder, denies any saddle anesthesia, numbness or weakness.  The history is provided by the patient.       Prior to Admission medications   Medication Sig Start Date End Date Taking? Authorizing Provider  cetirizine -pseudoephedrine  (ZYRTEC -D) 5-120 MG tablet Take 1 tablet by mouth daily as needed for allergies. 03/27/23   Silver Wonda LABOR, PA  hydrocortisone  cream 1 % Apply to affected area 2 times daily for 1 week 02/20/21   Phebe Fonda RAMAN, MD  lisinopril (ZESTRIL) 5 MG tablet Take 5 mg by mouth daily.    [provider]  loratadine  (CLARITIN ) 10 MG tablet Take 1 tablet (10 mg total) by mouth daily. 03/07/17   Keith Sor, PA-C  sertraline  (ZOLOFT ) 100 MG tablet Take 1 tablet (100 mg total) by mouth at bedtime. PATIENT NEEDS OFFICE VISIT FOR ADDITIONAL REFILLS 06/11/13   Lenon Juliane RAMAN, MD  valsartan (DIOVAN) 40 MG tablet Take 40 mg by mouth daily.    [provider]    Allergies:  Aspirin, Ibuprofen, Naproxen sodium, and Nsaids    Review of Systems  Updated Vital Signs BP (!) 145/94 (BP Location: Right Arm)   Pulse 76   Temp 98.4 F (36.9 C) (Oral)   Resp 17   Ht 5' 7 (1.702 m)   Wt 96.6 kg   LMP 06/03/2015   SpO2 98%   BMI 33.36 kg/m   Physical Exam Vitals and nursing note reviewed.  Constitutional:      Appearance: Normal appearance. She is obese.     Comments: Uncomfortable appearing  HENT:     Head: Normocephalic.     Nose: Nose normal.     Mouth/Throat:     Mouth: Mucous membranes are moist.  Eyes:     Extraocular Movements: Extraocular movements intact.  Cardiovascular:     Rate and Rhythm: Normal rate.  Pulmonary:     Effort: Pulmonary effort is normal.  Abdominal:     General: Abdomen is flat.  Musculoskeletal:     Cervical back: Normal range of motion and neck supple.     Comments: No midline back tenderness, tenderness to palpation of bilateral lumbar paraspinal muscles  Pelvis stable, non-tender No hip tenderness with internal/external rotation intact  Skin:    General: Skin is warm and dry.  Neurological:     General: No focal deficit present.     Mental Status: She is alert and oriented to person, place, and time.  Sensory: No sensory deficit.     Motor: No weakness (5/5 strength in bilateral LE).  Psychiatric:        Mood and Affect: Mood normal.        Behavior: Behavior normal.     (all labs ordered are listed, but only abnormal results are displayed) Labs Reviewed - No data to display  EKG: None  Radiology: No results found.   Procedures   Medications Ordered in the ED - No data to display  Clinical Course as of 07/01/24 1516  Wed Jul 01, 2024  1516 Patient signed out to Dr. Ula pending CT imaging. [VK]    Clinical Course User Index [VK] Kingsley, Rosielee Corporan K, DO                                 Medical Decision Making This patient presents to the ED with chief complaint(s) of back pain, fall with  pertinent past medical history of HTN which further complicates the presenting complaint. The complaint involves an extensive differential diagnosis and also carries with it a high risk of complications and morbidity.    The differential diagnosis includes lumbar fracture, contusion, no signs of radiculopathy, no neurologic deficits or loss of bowel or bladder making cauda equina unlikely   Additional history obtained: Additional history obtained from N/A Records reviewed N/A  ED Course and Reassessment: On patient's arrival she is hemodynamically stable, uncomfortable appearing but in no acute distress and without any focal neurologic deficits.  Concerning for contusion versus fracture and will have a lumbar CT scan performed.  No other traumatic injuries seen on exam.  She declined any additional pain control at this time and will be closely reassessed.  Independent labs interpretation:  N/A  Independent visualization of imaging: - Pending   Amount and/or Complexity of Data Reviewed Radiology: ordered.       Final diagnoses:  None    ED Discharge Orders     None          Kingsley, Aalijah Lanphere K, [place] 07/01/24 1516

## 2024-09-15 ENCOUNTER — Encounter (HOSPITAL_BASED_OUTPATIENT_CLINIC_OR_DEPARTMENT_OTHER): Payer: Self-pay | Admitting: Cardiology

## 2024-09-15 ENCOUNTER — Ambulatory Visit (INDEPENDENT_AMBULATORY_CARE_PROVIDER_SITE_OTHER): Admitting: Cardiology

## 2024-09-15 VITALS — BP 118/74 | HR 68 | Ht 67.0 in | Wt 219.6 lb

## 2024-09-15 DIAGNOSIS — Z7189 Other specified counseling: Secondary | ICD-10-CM

## 2024-09-15 DIAGNOSIS — Z7689 Persons encountering health services in other specified circumstances: Secondary | ICD-10-CM

## 2024-09-15 DIAGNOSIS — R001 Bradycardia, unspecified: Secondary | ICD-10-CM | POA: Diagnosis not present

## 2024-09-15 DIAGNOSIS — I10 Essential (primary) hypertension: Secondary | ICD-10-CM

## 2024-09-15 DIAGNOSIS — R42 Dizziness and giddiness: Secondary | ICD-10-CM

## 2024-09-15 NOTE — Patient Instructions (Signed)
 Medication Instructions:   Your physician recommends that you continue on your current medications as directed. Please refer to the Current Medication list given to you today.  *If you need a refill on your cardiac medications before your next appointment, please call your pharmacy*    Follow-Up:  AS NEEDED WITH DR. LONNI  Other Instructions

## 2024-09-15 NOTE — Progress Notes (Signed)
 " Cardiology Office Note:  .   Date:  09/15/2024  ID:  Erica Zhang, DOB [birth year], MRN [id number] PCP: Waylan Almarie SAUNDERS, MD  [facility] Providers Cardiologist:  Shelda Bruckner, MD {  History of Present Illness: Erica   Anali H Zhang is a 59 y.o. female with PMH hypertension, mixed hyperlipidemia who is seen for new patient consultation for bradycardia at the request of Dr. Waylan.  Referral from 05/07/24 from Dr. Waylan reviewed. Referred for bradycardia. Notes under media tab include office visit from 04/22/24. Noted intermittent low heart rates. Is ordered for long acting propranolol, was unclear if she was taking at the time of low heart rate. Referred to cardiology for further evaluation.  Today: Did not do well with propranolol. Thinks that this raised her blood pressure. Tolerates lisinopril well. Trying to manage cholesterol without statins.  After working out, her heart rate with read in the 45-50 bpm range, some time after finishing using a treadclimber (heart rate readout is on the machine). She can get heart rate up to 150 bpm with significant exertion on this machine at peak exercise. Since her back injury, her resting heart rate has been more 60s-70s.   She is adopted, unknown family history. Her daughter however also has resting bradycardia, exercises regularly.  No syncope, does have occasional dizziness and feels like she might pass out. Not a sensation of motion. During an event walking out of the vet, she was able to check her BP just after and it was normal. At other times, has feeling like crossing lines in her vision. Worse when it is hot outside. She does not like to drink water, drinks a lot of coffee, discussed hydration.   Was told in 1990 she had mitral valve prolapse, discussed the history of this, that most are no longer accurate diagnosis. Also had intermittent tachycardia after her pregnancy, sounds like SVT from description.  Feels  generally swollen when she eats a lot of sodium, trying to monitor and cut down on this. Does get intermittent jaw pain, had TMJ, none recently.   ROS: Denies chest pain, shortness of breath at rest. No PND, orthopnea, LE edema or unexpected weight gain. No full syncope or palpitations. ROS otherwise negative except as noted.   Studies Reviewed: Erica    EKG:  EKG Interpretation Date/Time:  Tuesday September 15 2024 16:26:37 EST Ventricular Rate:  72 PR Interval:  136 QRS Duration:  90 QT Interval:  386 QTC Calculation: 422 R Axis:   30  Text Interpretation: Normal sinus rhythm Confirmed by Bruckner Shelda [phone]) on 09/15/2024 4:49:30 PM    Physical Exam:   VS:  BP 118/74   Pulse 68   Ht 5' 7 (1.702 m)   Wt 219 lb 9.6 oz (99.6 kg)   LMP 06/03/2015   SpO2 99%   BMI 34.39 kg/m    Wt Readings from Last 3 Encounters:  09/15/24 219 lb 9.6 oz (99.6 kg)  07/01/24 213 lb (96.6 kg)  03/27/23 218 lb 0.6 oz (98.9 kg)    GEN: Well nourished, well developed in no acute distress HEENT: Normal, moist mucous membranes NECK: No JVD CARDIAC: regular rhythm, normal S1 and S2, no rubs or gallops. No murmur. VASCULAR: Radial and DP pulses 2+ bilaterally. No carotid bruits RESPIRATORY:  Clear to auscultation without rales, wheezing or rhonchi  ABDOMEN: Soft, non-tender, non-distended MUSCULOSKELETAL:  Ambulates independently SKIN: Warm and dry, no edema NEUROLOGIC:  Alert and oriented x 3. No focal neuro  deficits noted. PSYCHIATRIC:  Normal affect    ASSESSMENT AND PLAN: .    Bradycardia -ECG unremarkable today -slow resting heart rates when she is at her best exercise capacity. Discussed that this can be normal due to level of vagal tone, her HR has gone up somewhat since being limited from her back injury -no syncope or high risk features  Intermittent lightheadedness -pattern suggestive of dehydration triggering vagal reflex, has not had full syncope. Reviewed recommendation for  hydration  Hypertension -notes history of valsartan, propranolol increasing blood pressure -at goal today, continue lisinopril  Reports history of mitral valve prolapse, no click/murmur heard on exam, no indication for echo at this time  CV risk counseling and prevention -recommend heart healthy/Mediterranean diet, with whole grains, fruits, vegetable, fish, lean meats, nuts, and olive oil. Limit salt. -recommend moderate walking, 3-5 times/week for 30-50 minutes each session. Aim for at least 150 minutes/week. Goal should be pace of 3 miles/hours, or walking 1.5 miles in 30 minutes -recommend avoidance of tobacco products. Avoid excess alcohol.  Dispo: I would be happy to see her back as needed  Signed, Shelda Bruckner, MD   Shelda Bruckner, MD, PhD, [facility] [place]  [facility]  [facility] [address] [place], [place] [address] [phone]   "

## 2024-09-21 ENCOUNTER — Ambulatory Visit: Admitting: Podiatry

## 2024-09-24 ENCOUNTER — Ambulatory Visit: Admitting: Podiatry

## 2024-10-05 ENCOUNTER — Ambulatory Visit: Admitting: Podiatry
